# Patient Record
Sex: Female | Born: 1935 | Race: White | Hispanic: No | Smoking: Former smoker
Health system: Southern US, Community
[De-identification: ages and names within clinical notes are randomized; demographics above are authoritative.]

## PROBLEM LIST (undated history)

## (undated) DIAGNOSIS — H269 Unspecified cataract: Secondary | ICD-10-CM

## (undated) DIAGNOSIS — C801 Malignant (primary) neoplasm, unspecified: Secondary | ICD-10-CM

## (undated) DIAGNOSIS — F0150 Vascular dementia without behavioral disturbance: Secondary | ICD-10-CM

## (undated) DIAGNOSIS — H9190 Unspecified hearing loss, unspecified ear: Secondary | ICD-10-CM

## (undated) DIAGNOSIS — M810 Age-related osteoporosis without current pathological fracture: Secondary | ICD-10-CM

## (undated) HISTORY — DX: Unspecified hearing loss, unspecified ear: H91.90

## (undated) HISTORY — DX: Unspecified cataract: H26.9

## (undated) HISTORY — DX: Age-related osteoporosis without current pathological fracture: M81.0

## (undated) HISTORY — PX: BREAST CYST ASPIRATION: SHX578

## (undated) HISTORY — PX: HERNIA REPAIR: SHX51

## (undated) HISTORY — DX: Vascular dementia, unspecified severity, without behavioral disturbance, psychotic disturbance, mood disturbance, and anxiety: F01.50

## (undated) HISTORY — PX: BREAST BIOPSY: SHX20

## (undated) HISTORY — PX: EYE SURGERY: SHX253

---

## 2001-02-14 ENCOUNTER — Encounter: Admission: RE | Admit: 2001-02-14 | Discharge: 2001-02-14 | Payer: Self-pay | Admitting: General Surgery

## 2001-02-14 ENCOUNTER — Encounter: Payer: Self-pay | Admitting: General Surgery

## 2004-01-01 ENCOUNTER — Ambulatory Visit: Payer: Self-pay | Admitting: Family Medicine

## 2005-02-16 ENCOUNTER — Ambulatory Visit: Payer: Self-pay | Admitting: Family Medicine

## 2005-02-25 ENCOUNTER — Ambulatory Visit: Payer: Self-pay | Admitting: Family Medicine

## 2005-09-02 ENCOUNTER — Ambulatory Visit: Payer: Self-pay | Admitting: General Surgery

## 2006-03-23 ENCOUNTER — Ambulatory Visit: Payer: Self-pay | Admitting: Family Medicine

## 2007-04-28 ENCOUNTER — Ambulatory Visit: Payer: Self-pay | Admitting: Family Medicine

## 2008-04-30 ENCOUNTER — Ambulatory Visit: Payer: Self-pay | Admitting: Family Medicine

## 2009-01-16 ENCOUNTER — Ambulatory Visit: Payer: Self-pay | Admitting: Family Medicine

## 2009-05-22 ENCOUNTER — Ambulatory Visit: Payer: Self-pay | Admitting: Family Medicine

## 2010-05-26 ENCOUNTER — Ambulatory Visit: Payer: Self-pay | Admitting: Family Medicine

## 2010-06-08 ENCOUNTER — Emergency Department: Payer: Self-pay | Admitting: Unknown Physician Specialty

## 2011-06-26 ENCOUNTER — Ambulatory Visit: Payer: Self-pay | Admitting: Family Medicine

## 2012-06-30 ENCOUNTER — Ambulatory Visit: Payer: Self-pay | Admitting: Family Medicine

## 2013-02-15 ENCOUNTER — Encounter: Payer: Self-pay | Admitting: Podiatry

## 2013-02-15 ENCOUNTER — Ambulatory Visit (INDEPENDENT_AMBULATORY_CARE_PROVIDER_SITE_OTHER): Payer: Medicare Other | Admitting: Podiatry

## 2013-02-15 VITALS — BP 149/89 | HR 93 | Resp 16 | Ht 63.0 in | Wt 125.0 lb

## 2013-02-15 DIAGNOSIS — M79609 Pain in unspecified limb: Secondary | ICD-10-CM

## 2013-02-15 DIAGNOSIS — B351 Tinea unguium: Secondary | ICD-10-CM

## 2013-02-15 DIAGNOSIS — B353 Tinea pedis: Secondary | ICD-10-CM

## 2013-02-15 NOTE — Progress Notes (Signed)
   Subjective:    Patient ID: Bridget Nolan, female    DOB: Aug 04, 1921, 80 y.o.   MRN: [id number]  HPI Comments: Its the 4th toenail on my right foot. It does not hurt. It needs cutting, but i waited till i came in here so he could see it. Its been thick and black with this white stuff under it for about 6 months.     Review of Systems  Constitutional: Negative.   HENT: Positive for hearing loss.   Eyes: Negative.   Respiratory: Negative.   Cardiovascular: Negative.   Gastrointestinal: Negative.   Endocrine: Negative.   Genitourinary: Negative.   Musculoskeletal: Negative.   Skin: Negative.   Allergic/Immunologic: Negative.   Hematological: Bruises/bleeds easily.  Psychiatric/Behavioral: Negative.        Objective:   Physical Exam: I have reviewed her past medical history medications allergies surgeries social history and review of systems. Vital signs are stable she is alert and oriented x3. Pulses are strongly palpable bilateral foot. Neurologic sensorium is intact per Semmes-Weinstein monofilament bilateral. Deep tendon reflexes bilateral are intact and brisk. Muscle strength + over 5 dorsiflexors plantar flexors inverters everters all intrinsic musculature is intact. Orthopedic evaluation demonstrates all joints distal to the ankle a full range of motion without crepitation. Cutaneous evaluation demonstrates supple well hydrated cutis normal nail plates bilateral. The only exception to that is the fourth digit of the right foot which demonstrates a reactive thickened nail plate secondary to a slight hammertoe deformity. There is subungual hematoma present with subungual debris.        Assessment & Plan:  Assessment: Pain in limb secondary to onychomycosis fourth digit of the right foot.  Plan: Debridement today of the nail plate to the fourth digit of the right foot. Followup with her as needed.

## 2013-07-03 ENCOUNTER — Ambulatory Visit: Payer: Self-pay | Admitting: Family Medicine

## 2014-06-12 ENCOUNTER — Other Ambulatory Visit: Payer: Self-pay | Admitting: Family Medicine

## 2014-06-12 DIAGNOSIS — Z1231 Encounter for screening mammogram for malignant neoplasm of breast: Secondary | ICD-10-CM

## 2014-06-26 ENCOUNTER — Ambulatory Visit: Payer: Self-pay

## 2014-07-05 ENCOUNTER — Ambulatory Visit
Admission: RE | Admit: 2014-07-05 | Discharge: 2014-07-05 | Disposition: A | Discharge status: Home / Self Care | Payer: Medicare Other | Source: Ambulatory Visit | Attending: Family Medicine | Admitting: Family Medicine

## 2014-07-05 DIAGNOSIS — R922 Inconclusive mammogram: Secondary | ICD-10-CM | POA: Insufficient documentation

## 2014-07-05 DIAGNOSIS — Z1231 Encounter for screening mammogram for malignant neoplasm of breast: Secondary | ICD-10-CM | POA: Diagnosis not present

## 2014-07-05 HISTORY — DX: Malignant (primary) neoplasm, unspecified: C80.1

## 2015-02-01 ENCOUNTER — Emergency Department: Payer: Medicare Other

## 2015-02-01 ENCOUNTER — Inpatient Hospital Stay
Admission: EM | Admit: 2015-02-01 | Discharge: 2015-02-04 | DRG: 481 | Disposition: A | Discharge status: Skilled Nursing Facility | Payer: Medicare Other | Attending: Internal Medicine | Admitting: Internal Medicine

## 2015-02-01 DIAGNOSIS — W010XXA Fall on same level from slipping, tripping and stumbling without subsequent striking against object, initial encounter: Secondary | ICD-10-CM | POA: Diagnosis present

## 2015-02-01 DIAGNOSIS — Z880 Allergy status to penicillin: Secondary | ICD-10-CM | POA: Diagnosis not present

## 2015-02-01 DIAGNOSIS — Y92090 Kitchen in other non-institutional residence as the place of occurrence of the external cause: Secondary | ICD-10-CM | POA: Diagnosis not present

## 2015-02-01 DIAGNOSIS — R739 Hyperglycemia, unspecified: Secondary | ICD-10-CM | POA: Diagnosis present

## 2015-02-01 DIAGNOSIS — S72002A Fracture of unspecified part of neck of left femur, initial encounter for closed fracture: Secondary | ICD-10-CM

## 2015-02-01 DIAGNOSIS — Z85828 Personal history of other malignant neoplasm of skin: Secondary | ICD-10-CM

## 2015-02-01 DIAGNOSIS — H9190 Unspecified hearing loss, unspecified ear: Secondary | ICD-10-CM | POA: Diagnosis present

## 2015-02-01 DIAGNOSIS — S72142A Displaced intertrochanteric fracture of left femur, initial encounter for closed fracture: Secondary | ICD-10-CM | POA: Diagnosis present

## 2015-02-01 DIAGNOSIS — Z01818 Encounter for other preprocedural examination: Secondary | ICD-10-CM

## 2015-02-01 DIAGNOSIS — M8580 Other specified disorders of bone density and structure, unspecified site: Secondary | ICD-10-CM | POA: Diagnosis present

## 2015-02-01 DIAGNOSIS — S7292XA Unspecified fracture of left femur, initial encounter for closed fracture: Secondary | ICD-10-CM

## 2015-02-01 DIAGNOSIS — E871 Hypo-osmolality and hyponatremia: Secondary | ICD-10-CM | POA: Diagnosis present

## 2015-02-01 DIAGNOSIS — Y939 Activity, unspecified: Secondary | ICD-10-CM | POA: Diagnosis not present

## 2015-02-01 LAB — CBC WITH DIFFERENTIAL/PLATELET
BASOS ABS: 0 10*3/uL (ref 0–0.1)
BASOS PCT: 0 %
Eosinophils Absolute: 0.1 10*3/uL (ref 0–0.7)
Eosinophils Relative: 1 %
HEMATOCRIT: 39.2 % (ref 35.0–47.0)
HEMOGLOBIN: 13.5 g/dL (ref 12.0–16.0)
Lymphocytes Relative: 5 %
Lymphs Abs: 0.5 10*3/uL — ABNORMAL LOW (ref 1.0–3.6)
MCH: 33.6 pg (ref 26.0–34.0)
MCHC: 34.4 g/dL (ref 32.0–36.0)
MCV: 97.8 fL (ref 80.0–100.0)
MONO ABS: 0.5 10*3/uL (ref 0.2–0.9)
Monocytes Relative: 5 %
NEUTROS ABS: 9.9 10*3/uL — AB (ref 1.4–6.5)
NEUTROS PCT: 89 %
Platelets: 200 10*3/uL (ref 150–440)
RBC: 4.01 MIL/uL (ref 3.80–5.20)
RDW: 12.3 % (ref 11.5–14.5)
WBC: 11 10*3/uL (ref 3.6–11.0)

## 2015-02-01 LAB — PROTIME-INR
INR: 0.99
Prothrombin Time: 13.3 seconds (ref 11.4–15.0)

## 2015-02-01 LAB — BASIC METABOLIC PANEL
ANION GAP: 5 (ref 5–15)
BUN: 23 mg/dL — ABNORMAL HIGH (ref 6–20)
CALCIUM: 9.1 mg/dL (ref 8.9–10.3)
CO2: 24 mmol/L (ref 22–32)
Chloride: 107 mmol/L (ref 101–111)
Creatinine, Ser: 0.89 mg/dL (ref 0.44–1.00)
GFR calc non Af Amer: 55 mL/min — ABNORMAL LOW (ref >60)
Glucose, Bld: 171 mg/dL — ABNORMAL HIGH (ref 65–99)
Potassium: 3.3 mmol/L — ABNORMAL LOW (ref 3.5–5.1)
Sodium: 136 mmol/L (ref 135–145)

## 2015-02-01 LAB — URINALYSIS COMPLETE WITH MICROSCOPIC (ARMC ONLY)
BACTERIA UA: NONE SEEN
Bilirubin Urine: NEGATIVE
Glucose, UA: 50 mg/dL — AB
Hgb urine dipstick: NEGATIVE
Ketones, ur: NEGATIVE mg/dL
Leukocytes, UA: NEGATIVE
Nitrite: NEGATIVE
PH: 7 (ref 5.0–8.0)
PROTEIN: NEGATIVE mg/dL
SQUAMOUS EPITHELIAL / LPF: NONE SEEN
Specific Gravity, Urine: 1.012 (ref 1.005–1.030)

## 2015-02-01 MED ORDER — OXYCODONE HCL 5 MG PO TABS
5.0000 mg | ORAL_TABLET | ORAL | Status: DC | PRN
  Administered 2015-02-02 (×2): 5 mg via ORAL
  Filled 2015-02-01 (×3): qty 1

## 2015-02-01 MED ORDER — ONDANSETRON HCL 4 MG PO TABS: 4.0000 mg | ORAL_TABLET | Freq: Four times a day (QID) | ORAL | Status: DC | PRN

## 2015-02-01 MED ORDER — ACETAMINOPHEN 325 MG PO TABS
650.0000 mg | ORAL_TABLET | Freq: Four times a day (QID) | ORAL | Status: DC | PRN
  Administered 2015-02-02: 650 mg via ORAL
  Filled 2015-02-01: qty 2

## 2015-02-01 MED ORDER — SODIUM CHLORIDE 0.9 % IV SOLN
INTRAVENOUS | Status: DC
  Administered 2015-02-01 – 2015-02-03 (×3): via INTRAVENOUS

## 2015-02-01 MED ORDER — CLINDAMYCIN PHOSPHATE 600 MG/50ML IV SOLN
600.0000 mg | Freq: Once | INTRAVENOUS | Status: AC
  Administered 2015-02-02: 600 mg via INTRAVENOUS
  Filled 2015-02-01: qty 50

## 2015-02-01 MED ORDER — MORPHINE SULFATE (PF) 2 MG/ML IV SOLN
2.0000 mg | Freq: Once | INTRAVENOUS | Status: AC
  Administered 2015-02-01: 2 mg via INTRAVENOUS
  Filled 2015-02-01: qty 1

## 2015-02-01 MED ORDER — ONDANSETRON HCL 4 MG/2ML IJ SOLN: 4.0000 mg | Freq: Four times a day (QID) | INTRAMUSCULAR | Status: DC | PRN

## 2015-02-01 MED ORDER — MORPHINE SULFATE (PF) 2 MG/ML IV SOLN
2.0000 mg | INTRAVENOUS | Status: DC | PRN
  Administered 2015-02-02 – 2015-02-03 (×4): 2 mg via INTRAVENOUS
  Filled 2015-02-01 (×5): qty 1

## 2015-02-01 MED ORDER — ACETAMINOPHEN 650 MG RE SUPP: 650.0000 mg | Freq: Four times a day (QID) | RECTAL | Status: DC | PRN

## 2015-02-01 MED ORDER — ONDANSETRON HCL 4 MG/2ML IJ SOLN
4.0000 mg | Freq: Once | INTRAMUSCULAR | Status: AC
  Administered 2015-02-01: 4 mg via INTRAVENOUS
  Filled 2015-02-01: qty 2

## 2015-02-01 MED ORDER — DOCUSATE SODIUM 100 MG PO CAPS
100.0000 mg | ORAL_CAPSULE | Freq: Two times a day (BID) | ORAL | Status: DC
  Administered 2015-02-01 – 2015-02-04 (×4): 100 mg via ORAL
  Filled 2015-02-01 (×5): qty 1

## 2015-02-01 NOTE — H&P (Signed)
[facility] [place] NAME: Bridget Nolan    MR#:  [id number]  DATE OF BIRTH:  Dec 18, 1921   DATE OF ADMISSION:  02/01/2015  PRIMARY CARE PHYSICIAN: Maryland Pink, MD   REQUESTING/REFERRING PHYSICIAN: Archie Balboa  CHIEF COMPLAINT:   Chief Complaint  Patient presents with  . Fall    left hip    HISTORY OF PRESENT ILLNESS:  Bridget Nolan  is a 81 y.o. female with a known history of skin cancer unspecified, osteopenia who is presenting after mechanical fall. She describes suffering mechanical fall at her nursing facility had immediate pain left hip described only as "pain" intensity 9/10 nonradiating worse with movements no relieving factors. Found to have a left intertrochanteric femur fracture  PAST MEDICAL HISTORY:   Past Medical History  Diagnosis Date  . Hearing loss   . Cancer ([place]) skin    PAST SURGICAL HISTORY:   Past Surgical History  Procedure Laterality Date  . Hernia repair    . Eye surgery Bilateral   . Breast biopsy Left neg  . Breast cyst aspiration Bilateral     SOCIAL HISTORY:   Social History  Substance Use Topics  . Smoking status: Never Smoker   . Smokeless tobacco: Never Used  . Alcohol Use: 4.8 oz/week    7 Glasses of wine, 1 Cans of beer per week     Comment: most days    FAMILY HISTORY:   Family History  Problem Relation Age of Onset  . Diabetes Neg Hx     DRUG ALLERGIES:   Allergies  Allergen Reactions  . Penicillins Rash    Has patient had a PCN reaction causing immediate rash, facial/tongue/throat swelling, SOB or lightheadedness with hypotension: Yes Has patient had a PCN reaction causing severe rash involving mucus membranes or skin necrosis: No Has patient had a PCN reaction that required hospitalization Yes Has patient had a PCN reaction occurring within the last 10 years: No If all of the above answers are "NO", then may proceed with Cephalosporin use.    REVIEW OF  SYSTEMS:  REVIEW OF SYSTEMS:  CONSTITUTIONAL: Denies fevers, chills, fatigue, weakness.  EYES: Denies blurred vision, double vision, or eye pain.  EARS, NOSE, THROAT: Denies tinnitus, ear pain, hearing loss.  RESPIRATORY: denies cough, shortness of breath, wheezing  CARDIOVASCULAR: Denies chest pain, palpitations, edema.  GASTROINTESTINAL: Denies nausea, vomiting, diarrhea, abdominal pain.  GENITOURINARY: Denies dysuria, hematuria.  ENDOCRINE: Denies nocturia or thyroid problems. HEMATOLOGIC AND LYMPHATIC: Denies easy bruising or bleeding.  SKIN: Denies rash or lesions.  MUSCULOSKELETAL: Denies pain in neck, back, shoulder, knees, , or further arthritic symptoms. Positive hip pain NEUROLOGIC: Denies paralysis, paresthesias.  PSYCHIATRIC: Denies anxiety or depressive symptoms. Otherwise full review of systems performed by me is negative.   MEDICATIONS AT HOME:   Prior to Admission medications   Medication Sig Start Date End Date Taking? Authorizing Provider  BIOTIN PO Take by mouth daily.   Yes Historical Provider, MD  cholecalciferol (VITAMIN D) 400 units TABS tablet Take 400 Units by mouth daily.   Yes Historical Provider, MD  Multiple Vitamin (MULTIVITAMIN WITH MINERALS) TABS tablet Take 1 tablet by mouth daily.   Yes Historical Provider, MD      VITAL SIGNS:  Blood pressure 160/80, pulse 102, temperature 97.6 F (36.4 C), temperature source Oral, resp. rate 17, height 5\' 3"  (1.6 m), weight 130 lb (58.968 kg), SpO2 87 %.  PHYSICAL EXAMINATION:  VITAL SIGNS: Filed Vitals:  02/01/15 2032 02/01/15 2230  BP: 160/89 160/80  Pulse: 102 102  Temp: 97.6 F (36.4 C)   Resp: 22 17   GENERAL:81 y.o.female currently in no acute distress.  HEAD: Normocephalic, atraumatic.  EYES: Pupils equal, round, reactive to light. Extraocular muscles intact. No scleral icterus.  MOUTH: Moist mucosal membrane. Dentition intact. No abscess noted.  EAR, NOSE, THROAT: Clear without exudates. No  external lesions.  NECK: Supple. No thyromegaly. No nodules. No JVD.  PULMONARY: Clear to ascultation, without wheeze rails or rhonci. No use of accessory muscles, Good respiratory effort. good air entry bilaterally CHEST: Nontender to palpation.  CARDIOVASCULAR: S1 and S2. Regular rate and rhythm. No murmurs, rubs, or gallops. No edema. Pedal pulses 2+ bilaterally.  GASTROINTESTINAL: Soft, nontender, nondistended. No masses. Positive bowel sounds. No hepatosplenomegaly.  MUSCULOSKELETAL: No swelling, clubbing, or edema. Range of motion limited left lower extremity second 2 fracture NEUROLOGIC: Cranial nerves II through XII are intact. No gross focal neurological deficits. Sensation intact. Reflexes intact.  SKIN: No ulceration, lesions, rashes, or cyanosis. Skin warm and dry. Turgor intact.  PSYCHIATRIC: Mood, affect within normal limits. The patient is awake, alert and oriented x 3. Insight, judgment intact.    LABORATORY PANEL:   CBC  Recent Labs Lab 02/01/15 2208  WBC 11.0  HGB 13.5  HCT 39.2  PLT 200   ------------------------------------------------------------------------------------------------------------------  Chemistries   Recent Labs Lab 02/01/15 2208  NA 136  K 3.3*  CL 107  CO2 24  GLUCOSE 171*  BUN 23*  CREATININE 0.89  CALCIUM 9.1   ------------------------------------------------------------------------------------------------------------------  Cardiac Enzymes No results for input(s): TROPONINI in the last 168 hours. ------------------------------------------------------------------------------------------------------------------  RADIOLOGY:  Dg Chest 1 View  02/01/2015  CLINICAL DATA:  Preop evaluation.  Femur fracture. EXAM: CHEST 1 VIEW COMPARISON:  06/08/2010 FINDINGS: Osteopenia. Patient rotated minimally left. Cardiomegaly accentuated by AP portable technique. Right hemidiaphragm elevation. No right-sided pleural effusion. No pneumothorax.  Moderate pulmonary interstitial thickening. Suboptimal evaluation of the inferior left hemi thorax. IMPRESSION: Cardiomegaly. Pulmonary interstitial thickening is nonspecific in this age group and could be partially artifactual, secondary to AP portable technique. Suboptimal evaluation of the inferior lateral left hemi thorax. Increased density in this area is likely due to overlying soft tissues. If there are cardiopulmonary symptoms, recommend PA and lateral radiographs. Electronically Signed   By: Abigail Miyamoto M.D.   On: 02/01/2015 21:20   Dg Hip Unilat With Pelvis 2-3 Views Left  02/01/2015  CLINICAL DATA:  Fall.  Initial encounter. EXAM: DG HIP (WITH OR WITHOUT PELVIS) 2-3V LEFT COMPARISON:  None. FINDINGS: Femoral heads are located. Mild osteopenia. Surgical clips projecting over the right hemipelvis. Markedly comminuted intratrochanteric left femur fracture. Varus angulation. IMPRESSION: Comminuted intratrochanteric left femur fracture. Electronically Signed   By: Abigail Miyamoto M.D.   On: 02/01/2015 21:17    EKG:   Orders placed or performed during the hospital encounter of 02/01/15  . ED EKG  . ED EKG  . EKG 12-Lead  . EKG 12-Lead    IMPRESSION AND PLAN:   81 year old Caucasian female history of osteopenia who is presenting after mechanical fall on  1. Preoperative evaluation for left intertrochanteric femur fracture: Moderate risk for moderate risk surgery no further intervention or testing required prior to surgery, perioperative medical management continue all home medications, she has no evidence of significant cardiovascular illness including congestive heart failure, valvular, arrhythmia. Continue pain medication, add bowel regiment, nothing by mouth after midnight for planned orthopedic or seizure 2. Venous thromboembolism  prophylactic: SCDs    All the records are reviewed and case discussed with ED provider. Management plans discussed with the patient, family and they are in  agreement.  CODE STATUS: DO NOT RESUSCITATE  TOTAL TIME TAKING CARE OF THIS PATIENT: 35 minutes.    Hower,  Karenann Cai.D on 02/01/2015 at 11:03 PM  Between 7am to 6pm - Pager - [phone]  After 6pm: House Pager: - [phone]  [facility]  Office  [phone]  CC: Primary care physician; Maryland Pink, MD

## 2015-02-01 NOTE — ED Notes (Signed)
Pt requested apple juice. MD notified. MD approved - Pt can drink until midnight.

## 2015-02-01 NOTE — ED Notes (Signed)
Pt reports she slipped/fell in the kitchen.  Her rubber soles stuck on the floor and she fell directly on her hip - she did not try to catch herself.  Pt denies LOC/head injury.  PT reports pain at 7 out of 10 in the left hip radiating down the left leg.

## 2015-02-01 NOTE — Consult Note (Signed)
ORTHOPAEDIC CONSULTATION  REQUESTING PHYSICIAN: Nance Pear, MD  Chief Complaint: Left hip pain  HPI: Bridget Nolan is a 81 y.o. female who complains of  Left hip pain after a mechanical fall this evening around 1900. She denies any LOC before or after the fall. She describes her pain as a sharp pain in her left hip that is 3/10 at rest and 6/10 with any attempted motion. She is currently comfortable on the ER bed and has no other complaints. She denies any headache, blurry vision or double vision. She denies any neck pain. She denies any chest pain or shortness of breath.  Past Medical History  Diagnosis Date  . Hearing loss   . Cancer [facility]) skin   Past Surgical History  Procedure Laterality Date  . Hernia repair    . Eye surgery Bilateral   . Breast biopsy Left neg  . Breast cyst aspiration Bilateral    Social History   Social History  . Marital Status: Married    Spouse Name: N/A  . Number of Children: N/A  . Years of Education: N/A   Social History Main Topics  . Smoking status: Never Smoker   . Smokeless tobacco: Never Used  . Alcohol Use: 4.8 oz/week    1 Cans of beer, 7 Glasses of wine per week     Comment: most days  . Drug Use: None  . Sexual Activity: Not Asked   Other Topics Concern  . None   Social History Narrative   History reviewed. No pertinent family history. Allergies  Allergen Reactions  . Penicillins Rash    Has patient had a PCN reaction causing immediate rash, facial/tongue/throat swelling, SOB or lightheadedness with hypotension: Yes Has patient had a PCN reaction causing severe rash involving mucus membranes or skin necrosis: No Has patient had a PCN reaction that required hospitalization Yes Has patient had a PCN reaction occurring within the last 10 years: No If all of the above answers are "NO", then may proceed with Cephalosporin use.   Prior to Admission medications   Medication Sig Start Date End Date Taking? Authorizing  Provider  BIOTIN PO Take by mouth daily.   Yes Historical Provider, MD  cholecalciferol (VITAMIN D) 400 units TABS tablet Take 400 Units by mouth daily.   Yes Historical Provider, MD  Multiple Vitamin (MULTIVITAMIN WITH MINERALS) TABS tablet Take 1 tablet by mouth daily.   Yes Historical Provider, MD   Dg Chest 1 View  02/01/2015  CLINICAL DATA:  Preop evaluation.  Femur fracture. EXAM: CHEST 1 VIEW COMPARISON:  06/08/2010 FINDINGS: Osteopenia. Patient rotated minimally left. Cardiomegaly accentuated by AP portable technique. Right hemidiaphragm elevation. No right-sided pleural effusion. No pneumothorax. Moderate pulmonary interstitial thickening. Suboptimal evaluation of the inferior left hemi thorax. IMPRESSION: Cardiomegaly. Pulmonary interstitial thickening is nonspecific in this age group and could be partially artifactual, secondary to AP portable technique. Suboptimal evaluation of the inferior lateral left hemi thorax. Increased density in this area is likely due to overlying soft tissues. If there are cardiopulmonary symptoms, recommend PA and lateral radiographs. Electronically Signed   By: Abigail Miyamoto M.D.   On: 02/01/2015 21:20   Dg Hip Unilat With Pelvis 2-3 Views Left  02/01/2015  CLINICAL DATA:  Fall.  Initial encounter. EXAM: DG HIP (WITH OR WITHOUT PELVIS) 2-3V LEFT COMPARISON:  None. FINDINGS: Femoral heads are located. Mild osteopenia. Surgical clips projecting over the right hemipelvis. Markedly comminuted intratrochanteric left femur fracture. Varus angulation. IMPRESSION: Comminuted intratrochanteric left femur  fracture. Electronically Signed   By: Abigail Miyamoto M.D.   On: 02/01/2015 21:17    Positive ROS: All other systems have been reviewed and were otherwise negative with the exception of those mentioned in the HPI and as above.  Physical Exam: General: Alert, no acute distress Cardiovascular: No pedal edema Respiratory: No cyanosis, no use of accessory musculature GI:  No organomegaly, abdomen is soft and non-tender Skin: No lesions in the area of chief complaint Neurologic: Sensation intact distally Psychiatric: Patient is competent for consent with normal mood and affect Lymphatic: No axillary or cervical lymphadenopathy  MUSCULOSKELETAL: she maintains her left hip and knee in a flexed, externally rotated posture. She has 2+ DP pulse and 1+ PT pulse bilaterally. She is able to actively move her left toes and ankle on command. She has some globally decreased sensation to light touch throughout the left foot in all nerve distributions.  Assessment: 81 y/o female with a left intertrochanteric femur fracture  Plan: Admit to hospital for risk stratification/ medical clearance and plan to proceed to OR tomorrow for intramedullary nail fixation of the left proximal femur       02/01/2015 10:29 PM

## 2015-02-01 NOTE — ED Notes (Signed)
MD at bedside. 

## 2015-02-01 NOTE — ED Provider Notes (Signed)
[facility] Emergency Department Provider Note    ____________________________________________  Time seen: 2150  I have reviewed the triage vital signs and the nursing notes.   HISTORY  Chief Complaint Fall   History limited by: Not Limited   HPI Bridget Nolan is a 81 y.o. female who presents to the emergency department today because of left hip pain. It started suddenly just prior to arrival. It started after the patient fell. She states she thinks she got her foot caught on the ground and fell onto the left side. The pain is now severe and worse with movement. Patient denies hitting her head. She denies any loss of consciousness or neck pain. Denies any pain in her upper extremities. Denies any chest pain shortness breath or palpitations at time of the fall.     Past Medical History  Diagnosis Date  . Hearing loss   . Cancer ([place]) skin    There are no active problems to display for this patient.   Past Surgical History  Procedure Laterality Date  . Hernia repair    . Eye surgery Bilateral   . Breast biopsy Left neg  . Breast cyst aspiration Bilateral     Current Outpatient Rx  Name  Route  Sig  Dispense  Refill  . BIOTIN PO   Oral   Take by mouth daily.         . Cholecalciferol (VITAMIN D PO)   Oral   Take by mouth daily.         . Multiple Vitamins-Minerals (MULTIVITAMIN PO)   Oral   Take by mouth daily.           Allergies Penicillins  History reviewed. No pertinent family history.  Social History Social History  Substance Use Topics  . Smoking status: Never Smoker   . Smokeless tobacco: Never Used  . Alcohol Use: 4.8 oz/week    1 Cans of beer, 7 Glasses of wine per week     Comment: most days    Review of Systems  Constitutional: Negative for fever. Cardiovascular: Negative for chest pain. Respiratory: Negative for shortness of breath. Gastrointestinal: Negative for abdominal pain, vomiting and  diarrhea. Musculoskeletal: Positive for left hip pain Neurological: Negative for headaches, focal weakness or numbness.   10-point ROS otherwise negative.  ____________________________________________   PHYSICAL EXAM:  VITAL SIGNS: ED Triage Vitals  Enc Vitals Group     BP 02/01/15 2030 160/88 mmHg     Pulse Rate 02/01/15 2030 104     Resp 02/01/15 2030 17     Temp 02/01/15 2032 97.6 F (36.4 C)     Temp Source 02/01/15 2032 Oral     SpO2 02/01/15 2024 96 %     Weight 02/01/15 2032 130 lb (58.968 kg)     Height 02/01/15 2032 5\' 3"  (1.6 m)     Head Cir --      Peak Flow --      Pain Score 02/01/15 2033 7   Constitutional: Alert and oriented. Well appearing and in no distress. Eyes: Conjunctivae are normal. PERRL. Normal extraocular movements. ENT   Head: Normocephalic and atraumatic.   Nose: No congestion/rhinnorhea.   Mouth/Throat: Mucous membranes are moist.   Neck: No stridor. No midline tenderness. Hematological/Lymphatic/Immunilogical: No cervical lymphadenopathy. Cardiovascular: Normal rate, regular rhythm.  No murmurs, rubs, or gallops. Respiratory: Normal respiratory effort without tachypnea nor retractions. Breath sounds are clear and equal bilaterally. No wheezes/rales/rhonchi. Gastrointestinal: Soft and nontender. No distention.  Genitourinary: Deferred Musculoskeletal: Left leg held in external rotation. Tenderness to manipulation and palpation of the left hip. Skin intact. Neurovascularly intact distally. No other extremity deformities. Neurologic:  Normal speech and language. No gross focal neurologic deficits are appreciated.  Skin:  Skin is warm, dry and intact. No rash noted. Psychiatric: Mood and affect are normal. Speech and behavior are normal. Patient exhibits appropriate insight and judgment.  ____________________________________________    LABS (pertinent positives/negatives)  Labs Reviewed  URINALYSIS COMPLETEWITH MICROSCOPIC  ([place] ONLY) - Abnormal; Notable for the following:    Color, Urine STRAW (*)    APPearance CLEAR (*)    Glucose, UA 50 (*)    All other components within normal limits  CBC WITH DIFFERENTIAL/PLATELET - Abnormal; Notable for the following:    Neutro Abs 9.9 (*)    Lymphs Abs 0.5 (*)    All other components within normal limits  BASIC METABOLIC PANEL - Abnormal; Notable for the following:    Potassium 3.3 (*)    Glucose, Bld 171 (*)    BUN 23 (*)    GFR calc non Af Amer 55 (*)    All other components within normal limits  PROTIME-INR  CBC  BASIC METABOLIC PANEL     ____________________________________________   EKG  Apolonio Schneiders, attending physician, personally viewed and interpreted this EKG  EKG Time: 2224 Rate: 100 Rhythm: sinus tachycardia Axis: left axis deviation Intervals: qtc 494 QRS: atypical RBBB ST changes: no st elevation Impression: abnormal ekg   ____________________________________________    RADIOLOGY CXR  IMPRESSION: Cardiomegaly. Pulmonary interstitial thickening is nonspecific in this age group and could be partially artifactual, secondary to AP portable technique.  Suboptimal evaluation of the inferior lateral left hemi thorax. Increased density in this area is likely due to overlying soft tissues. If there are cardiopulmonary symptoms, recommend PA and lateral radiographs.  Left hip IMPRESSION: Comminuted intratrochanteric left femur fracture.   ____________________________________________   PROCEDURES  Procedure(s) performed: None  Critical Care performed: No  ____________________________________________   INITIAL IMPRESSION / ASSESSMENT AND PLAN / ED COURSE  Pertinent labs & imaging results that were available during my care of the patient were reviewed by me and considered in my medical decision making (see chart for details).  Patient presented to the emergency department after what sounds like a mechanical fall  because of left hip pain. X-rays and physical exam consistent with a left hip fracture. Neurovascularly intact. I did discuss with orthopedics on-call. Will plan on admission to the hospital service.  ____________________________________________   FINAL CLINICAL IMPRESSION(S) / ED DIAGNOSES  Final diagnoses:  Closed fracture of left femur, unspecified fracture morphology, initial encounter ([place])     Nance Pear, MD 02/01/15 2330

## 2015-02-02 ENCOUNTER — Inpatient Hospital Stay: Payer: Medicare Other

## 2015-02-02 ENCOUNTER — Inpatient Hospital Stay: Payer: Medicare Other | Admitting: Anesthesiology

## 2015-02-02 ENCOUNTER — Encounter: Payer: Self-pay | Admitting: Anesthesiology

## 2015-02-02 ENCOUNTER — Encounter
Admission: EM | Disposition: A | Discharge status: Skilled Nursing Facility | Payer: Self-pay | Source: Home / Self Care | Attending: Internal Medicine

## 2015-02-02 HISTORY — PX: INTRAMEDULLARY (IM) NAIL INTERTROCHANTERIC: SHX5875

## 2015-02-02 LAB — BASIC METABOLIC PANEL
ANION GAP: 5 (ref 5–15)
BUN: 18 mg/dL (ref 6–20)
CHLORIDE: 106 mmol/L (ref 101–111)
CO2: 25 mmol/L (ref 22–32)
CREATININE: 0.72 mg/dL (ref 0.44–1.00)
Calcium: 8.8 mg/dL — ABNORMAL LOW (ref 8.9–10.3)
GFR calc non Af Amer: >60 mL/min (ref >60)
Glucose, Bld: 133 mg/dL — ABNORMAL HIGH (ref 65–99)
Potassium: 4.1 mmol/L (ref 3.5–5.1)
SODIUM: 136 mmol/L (ref 135–145)

## 2015-02-02 LAB — CBC
HEMATOCRIT: 37.4 % (ref 35.0–47.0)
HEMOGLOBIN: 12.8 g/dL (ref 12.0–16.0)
MCH: 33 pg (ref 26.0–34.0)
MCHC: 34.3 g/dL (ref 32.0–36.0)
MCV: 96.1 fL (ref 80.0–100.0)
Platelets: 184 10*3/uL (ref 150–440)
RBC: 3.89 MIL/uL (ref 3.80–5.20)
RDW: 12.3 % (ref 11.5–14.5)
WBC: 9.2 10*3/uL (ref 3.6–11.0)

## 2015-02-02 LAB — MRSA PCR SCREENING: MRSA by PCR: NEGATIVE

## 2015-02-02 SURGERY — FIXATION, FRACTURE, INTERTROCHANTERIC, WITH INTRAMEDULLARY ROD
Anesthesia: Spinal | Laterality: Left

## 2015-02-02 MED ORDER — BUPIVACAINE HCL (PF) 0.5 % IJ SOLN
INTRAMUSCULAR | Status: DC | PRN
  Administered 2015-02-02: 2.5 mL

## 2015-02-02 MED ORDER — FENTANYL CITRATE (PF) 100 MCG/2ML IJ SOLN
INTRAMUSCULAR | Status: DC | PRN
  Administered 2015-02-02 (×2): 25 ug via INTRAVENOUS

## 2015-02-02 MED ORDER — PROPOFOL 10 MG/ML IV BOLUS
INTRAVENOUS | Status: DC | PRN
  Administered 2015-02-02 (×3): 10 mg via INTRAVENOUS

## 2015-02-02 MED ORDER — LACTATED RINGERS IV SOLN
INTRAVENOUS | Status: DC | PRN
  Administered 2015-02-02: 14:00:00 via INTRAVENOUS

## 2015-02-02 MED ORDER — CHOLECALCIFEROL 10 MCG (400 UNIT) PO TABS
400.0000 [IU] | ORAL_TABLET | Freq: Every day | ORAL | Status: DC
  Administered 2015-02-03 – 2015-02-04 (×2): 400 [IU] via ORAL
  Filled 2015-02-02 (×2): qty 1

## 2015-02-02 MED ORDER — ONDANSETRON HCL 4 MG/2ML IJ SOLN: 4.0000 mg | Freq: Once | INTRAMUSCULAR | Status: DC | PRN

## 2015-02-02 MED ORDER — ENOXAPARIN SODIUM 40 MG/0.4ML ~~LOC~~ SOLN
40.0000 mg | SUBCUTANEOUS | Status: DC
  Administered 2015-02-03 – 2015-02-04 (×2): 40 mg via SUBCUTANEOUS
  Filled 2015-02-02 (×2): qty 0.4

## 2015-02-02 MED ORDER — ADULT MULTIVITAMIN W/MINERALS CH
1.0000 | ORAL_TABLET | Freq: Every day | ORAL | Status: DC
  Administered 2015-02-03 – 2015-02-04 (×2): 1 via ORAL
  Filled 2015-02-02 (×2): qty 1

## 2015-02-02 MED ORDER — FENTANYL CITRATE (PF) 100 MCG/2ML IJ SOLN: 25.0000 ug | INTRAMUSCULAR | Status: DC | PRN

## 2015-02-02 MED ORDER — CLINDAMYCIN PHOSPHATE 600 MG/50ML IV SOLN
600.0000 mg | Freq: Three times a day (TID) | INTRAVENOUS | Status: AC
  Administered 2015-02-02 – 2015-02-03 (×3): 600 mg via INTRAVENOUS
  Filled 2015-02-02 (×3): qty 50

## 2015-02-02 SURGICAL SUPPLY — 38 items
11.00mm titanium helical blade ×3 IMPLANT
BIT DRILL SPINE 4.0MMX260 (BIT) ×1
BIT DRILL SPINE 4.0X260 (BIT) ×2 IMPLANT
BLADE TI HELICAL 11.0 115 (BLADE) ×2 IMPLANT
BLADE TI HELICAL 11.0MM 115MM (BLADE) ×1
CANISTER SUCT 1200ML W/VALVE (MISCELLANEOUS) ×3 IMPLANT
DRAPE C-ARMOR (DRAPES) ×3 IMPLANT
DRAPE SHEET LG 3/4 BI-LAMINATE (DRAPES) ×3 IMPLANT
DRAPE TABLE BACK 80X90 (DRAPES) ×3 IMPLANT
DRSG DERMACEA 8X12 NADH (GAUZE/BANDAGES/DRESSINGS) ×3 IMPLANT
DRSG OPSITE POSTOP 4X12 (GAUZE/BANDAGES/DRESSINGS) ×3 IMPLANT
DRSG OPSITE POSTOP 4X6 (GAUZE/BANDAGES/DRESSINGS) ×3 IMPLANT
DURAPREP 26ML APPLICATOR (WOUND CARE) ×3 IMPLANT
GAUZE SPONGE 4X4 12PLY STRL (GAUZE/BANDAGES/DRESSINGS) ×3 IMPLANT
GLOVE BIO SURGEON STRL SZ7.5 (GLOVE) ×3 IMPLANT
GLOVE BIO SURGEON STRL SZ8 (GLOVE) ×3 IMPLANT
GLOVE BIOGEL M STRL SZ7.5 (GLOVE) ×3 IMPLANT
GLOVE INDICATOR 8.0 STRL GRN (GLOVE) ×3 IMPLANT
GLOVE SURG XRAY 8.5 LX (GLOVE) ×3 IMPLANT
GOWN STRL REUS W/ TWL LRG LVL4 (GOWN DISPOSABLE) ×2 IMPLANT
GOWN STRL REUS W/TWL LRG LVL4 (GOWN DISPOSABLE) ×4
KIT RM TURNOVER CYSTO AR (KITS) ×3 IMPLANT
MAT BLUE FLOOR 46X72 FLO (MISCELLANEOUS) ×3 IMPLANT
NAIL TROCH FX 12/130D 170-S (Nail) ×3 IMPLANT
NS IRRIG 1000ML POUR BTL (IV SOLUTION) ×3 IMPLANT
PACK HIP COMPR (MISCELLANEOUS) ×3 IMPLANT
PAD GROUND ADULT SPLIT (MISCELLANEOUS) ×3 IMPLANT
REAMER ROD DEEP FLUTE 2.5X950 (INSTRUMENTS) ×3 IMPLANT
SCREW LOCK TI 5.0X36 F/IM NAIL (Screw) ×3 IMPLANT
SOL PREP PVP 2OZ (MISCELLANEOUS) ×3
SOLUTION PREP PVP 2OZ (MISCELLANEOUS) ×1 IMPLANT
STAPLER SKIN PROX 35W (STAPLE) ×3 IMPLANT
SUCTION FRAZIER TIP 10 FR DISP (SUCTIONS) ×3 IMPLANT
SUT VIC AB 0 CT1 36 (SUTURE) ×3 IMPLANT
SUT VIC AB 1 CT1 36 (SUTURE) ×3 IMPLANT
SUT VIC AB 2-0 CT1 27 (SUTURE) ×2
SUT VIC AB 2-0 CT1 TAPERPNT 27 (SUTURE) ×1 IMPLANT
TAPE MICROFOAM 4IN (TAPE) ×3 IMPLANT

## 2015-02-02 NOTE — Progress Notes (Signed)
[facility] at [place] NAME: Bridget Nolan    MR#:  [id number]  DATE OF BIRTH:  Jun 12, 1921  SUBJECTIVE:  Patient is going to the operating room. She suffered a mechanical fall and now has fractured her left hip.   REVIEW OF SYSTEMS:    Review of Systems  Constitutional: Negative for fever, chills and malaise/fatigue.  HENT: Negative for sore throat.   Eyes: Negative for blurred vision.  Respiratory: Negative for cough, hemoptysis, shortness of breath and wheezing.   Cardiovascular: Negative for chest pain, palpitations and leg swelling.  Gastrointestinal: Negative for nausea, vomiting, abdominal pain, diarrhea and blood in stool.  Genitourinary: Negative for dysuria.  Musculoskeletal: Positive for joint pain and falls. Negative for back pain.  Neurological: Negative for dizziness, tremors and headaches.  Endo/Heme/Allergies: Does not bruise/bleed easily.    Tolerating Diet: Nothing by mouth      DRUG ALLERGIES:   Allergies  Allergen Reactions  . Penicillins Rash    Has patient had a PCN reaction causing immediate rash, facial/tongue/throat swelling, SOB or lightheadedness with hypotension: Yes Has patient had a PCN reaction causing severe rash involving mucus membranes or skin necrosis: No Has patient had a PCN reaction that required hospitalization Yes Has patient had a PCN reaction occurring within the last 10 years: No If all of the above answers are "NO", then may proceed with Cephalosporin use.    VITALS:  Blood pressure 154/71, pulse 90, temperature 98.6 F (37 C), temperature source Oral, resp. rate 18, height 5\' 3"  (1.6 m), weight 59.421 kg (131 lb), SpO2 93 %.  PHYSICAL EXAMINATION:   Physical Exam    LABORATORY PANEL:   CBC  Recent Labs Lab 02/02/15 0326  WBC 9.2  HGB 12.8  HCT 37.4  PLT 184    ------------------------------------------------------------------------------------------------------------------  Chemistries   Recent Labs Lab 02/02/15 0326  NA 136  K 4.1  CL 106  CO2 25  GLUCOSE 133*  BUN 18  CREATININE 0.72  CALCIUM 8.8*   ------------------------------------------------------------------------------------------------------------------  Cardiac Enzymes No results for input(s): TROPONINI in the last 168 hours. ------------------------------------------------------------------------------------------------------------------  RADIOLOGY:  Dg Chest 1 View  02/01/2015  CLINICAL DATA:  Preop evaluation.  Femur fracture. EXAM: CHEST 1 VIEW COMPARISON:  06/08/2010 FINDINGS: Osteopenia. Patient rotated minimally left. Cardiomegaly accentuated by AP portable technique. Right hemidiaphragm elevation. No right-sided pleural effusion. No pneumothorax. Moderate pulmonary interstitial thickening. Suboptimal evaluation of the inferior left hemi thorax. IMPRESSION: Cardiomegaly. Pulmonary interstitial thickening is nonspecific in this age group and could be partially artifactual, secondary to AP portable technique. Suboptimal evaluation of the inferior lateral left hemi thorax. Increased density in this area is likely due to overlying soft tissues. If there are cardiopulmonary symptoms, recommend PA and lateral radiographs. Electronically Signed   By: Abigail Miyamoto M.D.   On: 02/01/2015 21:20   Dg Hip Unilat With Pelvis 2-3 Views Left  02/01/2015  CLINICAL DATA:  Fall.  Initial encounter. EXAM: DG HIP (WITH OR WITHOUT PELVIS) 2-3V LEFT COMPARISON:  None. FINDINGS: Femoral heads are located. Mild osteopenia. Surgical clips projecting over the right hemipelvis. Markedly comminuted intratrochanteric left femur fracture. Varus angulation. IMPRESSION: Comminuted intratrochanteric left femur fracture. Electronically Signed   By: Abigail Miyamoto M.D.   On: 02/01/2015 21:17      ASSESSMENT AND PLAN:   81 year old female with a history of skin cancer who presented after mechanical fall and unfortunately has suffered a left femur fracture.   1. Left intertrochanteric  femur fracture: Patient is scheduled for the OR today. DVT prophylaxis as per orthopedic surgery and pain control as per orthopedic.   Management plans discussed with the patient and she is in agreement.  CODE STATUS:  DO NOT RESUSCITATE  TOTAL TIME TAKING CARE OF THIS PATIENT:  30 minutes.     POSSIBLE D/C  3 days, DEPENDING ON CLINICAL CONDITION.   Jaydian Santana M.D on 02/02/2015 at 11:18 AM  Between 7am to 6pm - Pager - [phone] After 6pm go to [email or web address] - password [email or web address] [facility]  Office  [phone]  CC: Primary care physician; Maryland Pink, MD  Note: This dictation was prepared with Dragon dictation along with smaller phrase technology. Any transcriptional errors that result from this process are unintentional.

## 2015-02-02 NOTE — Op Note (Signed)
DATE OF SURGERY:  02/02/2015  TIME: 3:35 PM  PATIENT NAME:  Bridget Nolan  AGE: 81 y.o.  PRE-OPERATIVE DIAGNOSIS:  left hip fracture  POST-OPERATIVE DIAGNOSIS:  SAME  PROCEDURE:  INTRAMEDULLARY (IM) NAIL INTERTROCHANTRIC  SURGEON:  Stacy Knickerbocker, MD  ASSISTANT:  None  OPERATIVE IMPLANTS: Synthes 78mm x 154mm 130 degree trochanteric femoral nail with interlocking helical blade into the femoral head.  PREOPERATIVE INDICATIONS:  [name] is a 81 y.o. year old who fell and suffered a hip fracture. She was brought into the ER and then admitted and optimized and then elected for surgical intervention.    The risks benefits and alternatives were discussed with the patient including but not limited to the risks of nonoperative treatment, versus surgical intervention including infection, bleeding, nerve injury, malunion, nonunion, hardware prominence, hardware failure, need for hardware removal, blood clots, cardiopulmonary complications, morbidity, mortality, among others, and they were willing to proceed.    OPERATIVE PROCEDURE:  The patient was brought to the operating room and placed in the supine position. Spinal anesthesia was administered, with a foley. She was placed on the fracture table.  Closed reduction was performed under C-arm guidance. The length of the femur was also measured using fluoroscopy. Time out was then performed after sterile prep and drape. She received preoperative antibiotics (600mg  clindamycin).  Incision was made proximal to the greater trochanter. A guidewire was placed in the appropriate position. Confirmation was made on AP and lateral views. The above-named nail was opened. I opened the proximal femur with a reamer. I then placed the nail by hand easily down. I did not need to ream the femur.  Once the nail was completely seated, I placed a guidepin into the femoral head into the center center position. I measured the length, and then reamed the lateral  cortex and up into the head. I then placed the helical blade. Slight compression was applied. Anatomic fixation achieved. Bone quality was mediocre.  I then secured the proximal interlocking bolt, and then placed a distal interlocking screw, and took final C-arm pictures AP and lateral the entire length of the implant. Anatomic reconstruction was achieved, and the wounds were irrigated copiously and closed with Vicryl followed by staples and sterile gauze for the skin. The patient was awakened and returned to PACU in stable and satisfactory condition. There no complications and the patient tolerated the procedure well.  She will be partial weightbearing for the next two weeks, and then WBAT. She will be on lovenox while in the hospital and converted to a daily aspirin upon discharge for a total of 4 weeks.

## 2015-02-02 NOTE — Anesthesia Procedure Notes (Signed)
Spinal Patient location during procedure: OR Start time: 02/02/2015 2:10 PM End time: 02/02/2015 2:30 PM Staffing Anesthesiologist: Martha Clan Performed by: anesthesiologist  Preanesthetic Checklist Completed: patient identified, site marked, surgical consent, pre-op evaluation, timeout performed, IV checked, risks and benefits discussed and monitors and equipment checked Spinal Block Patient position: sitting Prep: Betadine Patient monitoring: heart rate, continuous pulse ox, blood pressure and cardiac monitor Approach: midline Location: L4-5 Injection technique: single-shot Needle Needle type: Whitacre and Introducer  Needle gauge: 25 G Needle length: 9 cm Additional Notes Negative paresthesia. Negative blood return. Positive free-flowing CSF. Expiration date of kit checked and confirmed. Patient tolerated procedure well, without complications.

## 2015-02-02 NOTE — Anesthesia Preprocedure Evaluation (Addendum)
Anesthesia Evaluation  Patient identified by MRN, date of birth, ID band Patient awake    Reviewed: Allergy & Precautions, H&P , NPO status , Patient's Chart, lab work & pertinent test results, reviewed documented beta blocker date and time   History of Anesthesia Complications Negative for: history of anesthetic complications  Airway Mallampati: III  TM Distance: >3 FB Neck ROM: full    Dental no notable dental hx. (+) Teeth Intact, Chipped   Pulmonary neg pulmonary ROS,    Pulmonary exam normal breath sounds clear to auscultation       Cardiovascular Exercise Tolerance: Good negative cardio ROS Normal cardiovascular exam Rhythm:regular Rate:Normal     Neuro/Psych negative neurological ROS  negative psych ROS   GI/Hepatic negative GI ROS, Neg liver ROS,   Endo/Other  negative endocrine ROS  Renal/GU negative Renal ROS  negative genitourinary   Musculoskeletal   Abdominal   Peds  Hematology negative hematology ROS (+)   Anesthesia Other Findings Past Medical History:   Hearing loss                                                 Cancer (HCC)                                    skin         Reproductive/Obstetrics negative OB ROS                             Anesthesia Physical Anesthesia Plan  ASA: I  Anesthesia Plan: Spinal   Post-op Pain Management:    Induction:   Airway Management Planned:   Additional Equipment:   Intra-op Plan:   Post-operative Plan:   Informed Consent: I have reviewed the patients History and Physical, chart, labs and discussed the procedure including the risks, benefits and alternatives for the proposed anesthesia with the patient or authorized representative who has indicated his/her understanding and acceptance.   Dental Advisory Given  Plan Discussed with: Anesthesiologist, CRNA and Surgeon  Anesthesia Plan Comments:        Anesthesia  Quick Evaluation

## 2015-02-02 NOTE — Transfer of Care (Signed)
Immediate Anesthesia Transfer of Care Note  Patient: Bridget Nolan  Procedure(s) Performed: Procedure(s): INTRAMEDULLARY (IM) NAIL INTERTROCHANTRIC (Left)  Patient Location: PACU  Anesthesia Type:Spinal  Level of Consciousness: awake  Airway & Oxygen Therapy: Patient Spontanous Breathing  Post-op Assessment: Report given to RN  Post vital signs: Reviewed  Last Vitals:  Filed Vitals:   02/02/15 0418 02/02/15 0758  BP: 152/79 154/71  Pulse: 98 90  Temp: 36.6 C 37 C  Resp: 18 18    Complications: No apparent anesthesia complications

## 2015-02-02 NOTE — Progress Notes (Signed)
Patient arrived to unit from ED. Lhip Fx. Pain controlled with PRN medication. Patient alert and oriented. Skin CDI. Patient Bridget Nolan hearing aids. IV fluids infusing. NPO status mainted. Foley intact.

## 2015-02-03 ENCOUNTER — Encounter: Payer: Self-pay | Admitting: Orthopedic Surgery

## 2015-02-03 LAB — HEMOGLOBIN A1C: Hgb A1c MFr Bld: 5.5 % (ref 4.0–6.0)

## 2015-02-03 LAB — BASIC METABOLIC PANEL
ANION GAP: 5 (ref 5–15)
BUN: 12 mg/dL (ref 6–20)
CALCIUM: 8.3 mg/dL — AB (ref 8.9–10.3)
CO2: 27 mmol/L (ref 22–32)
CREATININE: 0.72 mg/dL (ref 0.44–1.00)
Chloride: 100 mmol/L — ABNORMAL LOW (ref 101–111)
GLUCOSE: 127 mg/dL — AB (ref 65–99)
Potassium: 3.7 mmol/L (ref 3.5–5.1)
Sodium: 132 mmol/L — ABNORMAL LOW (ref 135–145)

## 2015-02-03 LAB — CBC
HCT: 35.6 % (ref 35.0–47.0)
Hemoglobin: 12.1 g/dL (ref 12.0–16.0)
MCH: 32.7 pg (ref 26.0–34.0)
MCHC: 33.9 g/dL (ref 32.0–36.0)
MCV: 96.5 fL (ref 80.0–100.0)
PLATELETS: 154 10*3/uL (ref 150–440)
RBC: 3.69 MIL/uL — ABNORMAL LOW (ref 3.80–5.20)
RDW: 12.2 % (ref 11.5–14.5)
WBC: 7.9 10*3/uL (ref 3.6–11.0)

## 2015-02-03 NOTE — Progress Notes (Addendum)
Pt. Alert and oriented. VSS. POD 1. Pts. Pain controlled with pain meds per MAR. Pt. Dangled at the bedside. Foley patent. Neurochecks WDL. Resting quietly at this time. Will continue to monitor.

## 2015-02-03 NOTE — Anesthesia Post-op Follow-up Note (Signed)
  Anesthesia Pain Follow-up Note  Patient: Bridget Nolan  Day #: 1  Date of Follow-up: 02/03/2015 Time: 8:34 AM  Last Vitals:  Filed Vitals:   02/03/15 0533 02/03/15 0755  BP: 141/70 144/79  Pulse: 76 73  Temp: 36.7 C 36.6 C  Resp: 18 18    Level of Consciousness: alert  Pain: mild   Side Effects:None  Catheter Site Exam:clean, dry, no drainage  Plan: Continue current therapy and D/C from anesthesia care  Martha Clan

## 2015-02-03 NOTE — Progress Notes (Signed)
Pt. Dangled at the bedside. Tolerated it well but c/o severe pain in hip.

## 2015-02-03 NOTE — Progress Notes (Signed)
Pts. Foley d/c'd at 0620 with 350cc urine output

## 2015-02-03 NOTE — Evaluation (Cosign Needed)
Physical Therapy Evaluation Patient Details Name: Bridget Nolan MRN: [id number] DOB: July 05, 1921 Today's Date: 02/03/2015   History of Present Illness  Bridget Nolan is a 82 y.o. female with a known history of skin cancer unspecified, osteopenia who is presenting after mechanical fall. She describes suffering mechanical fall at her nursing facility . Found to have a left intertrochanteric femur fracture  Clinical Impression  Patient presents with 6/10 pain in LLE during mobility. She needs mod assist for bed mobility and transfers sit to stand with RW. She is not able to take more than 1 step due to inability to keep PWB precautions and push on AD for WB restrictions. She has 2/5 strength LLE . She will need SNF at [place] to reach her PLOF.    Follow Up Recommendations SNF    Equipment Recommendations  Rolling walker with 5" wheels    Recommendations for Other Services       Precautions / Restrictions Precautions Precautions: Fall Restrictions Weight Bearing Restrictions: Yes LLE Weight Bearing: Partial weight bearing      Mobility  Bed Mobility Overal bed mobility: Needs Assistance Bed Mobility: Supine to Sit     Supine to sit: Mod assist        Transfers Overall transfer level: Needs assistance Equipment used: Rolling walker (2 wheeled) Transfers: Sit to/from Stand Sit to Stand: Mod assist            Ambulation/Gait Ambulation/Gait assistance: Max assist Ambulation Distance (Feet): 1 Feet Assistive device: Rolling walker (2 wheeled) Gait Pattern/deviations: Step-to pattern     General Gait Details: antalgic  Stairs            Wheelchair Mobility    Modified Rankin (Stroke Patients Only)       Balance                                             Pertinent Vitals/Pain Pain Assessment: 0-10 Pain Score: 6  Pain Location: left leg Pain Intervention(s): Limited activity within patient's tolerance    Home Living                        Prior Function                 Hand Dominance        Extremity/Trunk Assessment                         Communication      Cognition Arousal/Alertness: Awake/alert Behavior During Therapy: WFL for tasks assessed/performed Overall Cognitive Status: Within Functional Limits for tasks assessed                      General Comments      Exercises General Exercises - Lower Extremity Ankle Circles/Pumps: AROM Quad Sets: AROM Gluteal Sets: AROM Short Arc Quad: AROM Hip ABduction/ADduction: AAROM Straight Leg Raises: AAROM      Assessment/Plan    PT Assessment    PT Diagnosis     PT Problem List    PT Treatment Interventions     PT Goals (Current goals can be found in the Care Plan section) Acute Rehab PT Goals Patient Stated Goal: Patient wants to walk    Frequency BID   Barriers to discharge  Co-evaluation               End of Session Equipment Utilized During Treatment: Gait belt Activity Tolerance: Patient limited by pain             Time: 1340-1410 PT Time Calculation (min) (ACUTE ONLY): 30 min   Charges:   PT Evaluation $Initial PT Evaluation Tier I: 1 Procedure PT Treatments $Therapeutic Exercise: 8-22 mins $Therapeutic Activity: 8-22 mins   PT G Codes:      Alanson Puls, PT, DPT  [place], Minette Headland S 02/03/2015, 3:08 PM

## 2015-02-03 NOTE — Progress Notes (Signed)
Patient POD 1. Patient demonstrates correct use of incentive spirometer with teach back. Patient weaned off O2. No acute distress noted.

## 2015-02-03 NOTE — Progress Notes (Addendum)
Paged Dr. Benjie Karvonen regarding patient diet. There are no orders to advance diet & patient would like some solid food rather than the clear liquid.

## 2015-02-03 NOTE — Progress Notes (Signed)
[facility] at [place] NAME: Bridget Nolan    MR#:  [id number]  DATE OF BIRTH:  20-Sep-1921  SUBJECTIVE:  Patient is soundly sleeping. No acute events overnight.   REVIEW OF SYSTEMS:    Review of Systems  Unable to perform ROS   Tolerating Diet: Yes       DRUG ALLERGIES:   Allergies  Allergen Reactions  . Penicillins Rash    Has patient had a PCN reaction causing immediate rash, facial/tongue/throat swelling, SOB or lightheadedness with hypotension: Yes Has patient had a PCN reaction causing severe rash involving mucus membranes or skin necrosis: No Has patient had a PCN reaction that required hospitalization Yes Has patient had a PCN reaction occurring within the last 10 years: No If all of the above answers are "NO", then may proceed with Cephalosporin use.    VITALS:  Blood pressure 144/79, pulse 73, temperature 97.8 F (36.6 C), temperature source Oral, resp. rate 18, height 5\' 3"  (1.6 m), weight 59.421 kg (131 lb), SpO2 97 %.  PHYSICAL EXAMINATION:   Physical Exam  Constitutional: She is well-developed, well-nourished, and in no distress. No distress.  HENT:  Head: Normocephalic.  Eyes: No scleral icterus.  Neck: Neck supple. No JVD present. No tracheal deviation present. Thyromegaly present.  Cardiovascular: Normal rate, regular rhythm and normal heart sounds.  Exam reveals no gallop and no friction rub.   No murmur heard. Pulmonary/Chest: Effort normal and breath sounds normal. No respiratory distress. She has no wheezes. She has no rales. She exhibits no tenderness.  Abdominal: Soft. Bowel sounds are normal. She exhibits no distension and no mass. There is no tenderness. There is no rebound and no guarding.  Musculoskeletal: Normal range of motion. She exhibits no edema.  Neurological:  Sleeping  Skin: Skin is warm. No rash noted. No erythema.      LABORATORY PANEL:   CBC  Recent Labs Lab  02/03/15 0412  WBC 7.9  HGB 12.1  HCT 35.6  PLT 154   ------------------------------------------------------------------------------------------------------------------  Chemistries   Recent Labs Lab 02/03/15 0412  NA 132*  K 3.7  CL 100*  CO2 27  GLUCOSE 127*  BUN 12  CREATININE 0.72  CALCIUM 8.3*   ------------------------------------------------------------------------------------------------------------------  Cardiac Enzymes No results for input(s): TROPONINI in the last 168 hours. ------------------------------------------------------------------------------------------------------------------  RADIOLOGY:  Dg Chest 1 View  02/01/2015  CLINICAL DATA:  Preop evaluation.  Femur fracture. EXAM: CHEST 1 VIEW COMPARISON:  06/08/2010 FINDINGS: Osteopenia. Patient rotated minimally left. Cardiomegaly accentuated by AP portable technique. Right hemidiaphragm elevation. No right-sided pleural effusion. No pneumothorax. Moderate pulmonary interstitial thickening. Suboptimal evaluation of the inferior left hemi thorax. IMPRESSION: Cardiomegaly. Pulmonary interstitial thickening is nonspecific in this age group and could be partially artifactual, secondary to AP portable technique. Suboptimal evaluation of the inferior lateral left hemi thorax. Increased density in this area is likely due to overlying soft tissues. If there are cardiopulmonary symptoms, recommend PA and lateral radiographs. Electronically Signed   By: Abigail Miyamoto M.D.   On: 02/01/2015 21:20   Dg Hip Operative Unilat With Pelvis Left  02/02/2015  CLINICAL DATA:  82 year old female with left hip fracture. Subsequent encounter. EXAM: OPERATIVE left HIP (WITH PELVIS IF PERFORMED) 4 VIEWS TECHNIQUE: Fluoroscopic spot image(s) were submitted for interpretation post-operatively. COMPARISON:  02/01/2015. FINDINGS: Post placement of sliding type screw transfixing the left intertrochanteric fracture without complication noted on  C-arm imaging. Follow-up two-view plain film exam can be obtained for  further delineation. IMPRESSION: Post open reduction and internal fixation left intertrochanteric fracture. Electronically Signed   By: Genia Del M.D.   On: 02/02/2015 15:53   Dg Hip Unilat With Pelvis 2-3 Views Left  02/01/2015  CLINICAL DATA:  Fall.  Initial encounter. EXAM: DG HIP (WITH OR WITHOUT PELVIS) 2-3V LEFT COMPARISON:  None. FINDINGS: Femoral heads are located. Mild osteopenia. Surgical clips projecting over the right hemipelvis. Markedly comminuted intratrochanteric left femur fracture. Varus angulation. IMPRESSION: Comminuted intratrochanteric left femur fracture. Electronically Signed   By: Abigail Miyamoto M.D.   On: 02/01/2015 21:17     ASSESSMENT AND PLAN:   82 year old female with a history of skin cancer who presented after mechanical fall and unfortunately has suffered a left femur fracture.   1. Left intertrochanteric femur fracture: Patient is  postoperative day #1 for intramedullary nail for the intertrochanteric fracture. DVT prophylaxis and pain control as per orthopedic.  2. Hyponatremia: Sodium at 132. Continue IV fluids for now and repeat sodium in a.m.   3. Hyperglycemia: Check hemoglobin A1c.  CODE STATUS:  DO NOT RESUSCITATE  TOTAL TIME TAKING CARE OF THIS PATIENT:  25 minutes.     POSSIBLE D/C  2 days, DEPENDING ON CLINICAL CONDITION.   Jolene Guyett M.D on 02/03/2015 at 9:01 AM  Between 7am to 6pm - Pager - [phone] After 6pm go to [email or web address] - password [email or web address] [facility]  Office  [phone]  CC: Primary care physician; Maryland Pink, MD  Note: This dictation was prepared with Dragon dictation along with smaller phrase technology. Any transcriptional errors that result from this process are unintentional.

## 2015-02-03 NOTE — Anesthesia Postprocedure Evaluation (Signed)
Anesthesia Post Note  Patient: AUNE ARIEL  Procedure(s) Performed: Procedure(s) (LRB): INTRAMEDULLARY (IM) NAIL INTERTROCHANTRIC (Left)  Patient location during evaluation: PACU Anesthesia Type: Spinal Level of consciousness: awake and alert Pain management: pain level controlled Vital Signs Assessment: post-procedure vital signs reviewed and stable Respiratory status: spontaneous breathing, nonlabored ventilation, respiratory function stable and patient connected to nasal cannula oxygen Cardiovascular status: blood pressure returned to baseline and stable Postop Assessment: no signs of nausea or vomiting Anesthetic complications: no    Last Vitals:  Filed Vitals:   02/03/15 0533 02/03/15 0755  BP: 141/70 144/79  Pulse: 76 73  Temp: 36.7 C 36.6 C  Resp: 18 18    Last Pain:  Filed Vitals:   02/03/15 0755  PainSc: 1                  Martha Clan

## 2015-02-03 NOTE — Progress Notes (Signed)
POD #1 s/p left proximal femur short TFN. Patientis awake and alert this AM in no acute distress. She denies any hip pain.   Wt Readings from Last 3 Encounters:  02/02/15 59.421 kg (131 lb)  02/15/13 56.7 kg (125 lb)   Temp Readings from Last 3 Encounters:  02/03/15 97.8 F (36.6 C)    BP Readings from Last 3 Encounters:  02/03/15 144/79  02/15/13 149/89   Pulse Readings from Last 3 Encounters:  02/03/15 73  02/15/13 93   Hip dressing is C/D/I. She is NVID to BLE.   A/P: POD#1 s/p IMN left proximal femur - PT, 50% WB to LLE for 2-3 weeks, then progress to WBAT - D/C planning

## 2015-02-03 NOTE — Care Management Note (Signed)
Case Management Note  Patient Details  Name: EZORA THIGPEN MRN: [id number] Date of Birth: [birth year]  Subjective/Objective:     82yo Mrs Inta Suski was admitted on 02/01/15 with a left femour fracture after a fall at home. Surgery on 02/02/15. Resides  alone at [facility]. PCP=Dr Maryland Pink. Pharmacy = [facility]. [facility]-PT is recommending SNF for Rehab. Referral to social work for placement.               Action/Plan:   Expected Discharge Date:                  Expected Discharge Plan:     In-House Referral:     Discharge planning Services     Post Acute Care Choice:    Choice offered to:     DME Arranged:    DME Agency:     HH Arranged:    [place] Agency:     Status of Service:     Medicare Important Message Given:    Date Medicare IM Given:    Medicare IM give by:    Date Additional Medicare IM Given:    Additional Medicare Important Message give by:     If discussed at [place] of Stay Meetings, dates discussed:    Additional Comments:  Tanashia Ciesla A, RN 02/03/2015, 4:11 PM

## 2015-02-03 NOTE — Progress Notes (Signed)
Paged Dr. Benjie Karvonen b/c there were no orders for a regular diet, only clear liquids.  Per Dr. Benjie Karvonen, okay to advance diet to regular.

## 2015-02-04 LAB — CBC
HEMATOCRIT: 33.8 % — AB (ref 35.0–47.0)
HEMOGLOBIN: 11.7 g/dL — AB (ref 12.0–16.0)
MCH: 34 pg (ref 26.0–34.0)
MCHC: 34.6 g/dL (ref 32.0–36.0)
MCV: 98.2 fL (ref 80.0–100.0)
Platelets: 143 10*3/uL — ABNORMAL LOW (ref 150–440)
RBC: 3.44 MIL/uL — AB (ref 3.80–5.20)
RDW: 12.3 % (ref 11.5–14.5)
WBC: 7.3 10*3/uL (ref 3.6–11.0)

## 2015-02-04 LAB — BASIC METABOLIC PANEL
Anion gap: 5 (ref 5–15)
BUN: 14 mg/dL (ref 6–20)
CHLORIDE: 104 mmol/L (ref 101–111)
CO2: 27 mmol/L (ref 22–32)
CREATININE: 0.65 mg/dL (ref 0.44–1.00)
Calcium: 8.5 mg/dL — ABNORMAL LOW (ref 8.9–10.3)
GFR calc Af Amer: >60 mL/min (ref >60)
GLUCOSE: 121 mg/dL — AB (ref 65–99)
POTASSIUM: 4 mmol/L (ref 3.5–5.1)
Sodium: 136 mmol/L (ref 135–145)

## 2015-02-04 MED ORDER — ACETAMINOPHEN 325 MG PO TABS: 650.0000 mg | ORAL_TABLET | Freq: Four times a day (QID) | ORAL | Status: DC | PRN

## 2015-02-04 MED ORDER — DOCUSATE SODIUM 100 MG PO CAPS: 100.0000 mg | ORAL_CAPSULE | Freq: Two times a day (BID) | ORAL | Status: DC

## 2015-02-04 MED ORDER — ENOXAPARIN SODIUM 40 MG/0.4ML ~~LOC~~ SOLN: 40.0000 mg | SUBCUTANEOUS | Status: DC

## 2015-02-04 NOTE — Discharge Summary (Signed)
[facility] [place] NAME: Bridget Nolan    MR#:  [id number]  DATE OF BIRTH:  1921-12-12  DATE OF ADMISSION:  02/01/2015 ADMITTING PHYSICIAN: Claud Kelp, MD  DATE OF DISCHARGE: 02/04/2015  PRIMARY CARE PHYSICIAN: Maryland Pink, MD    ADMISSION DIAGNOSIS:  Pre-op evaluation [Z01.818] Closed fracture of left femur, unspecified fracture morphology, initial encounter ([place]) [S72.92XA]  DISCHARGE DIAGNOSIS:  Active Problems:   Intertrochanteric fracture of left femur ([place])   SECONDARY DIAGNOSIS:   Past Medical History  Diagnosis Date  . Hearing loss   . Cancer [facility]) skin    HOSPITAL COURSE:   82 year old female with a history of skin cancer who presented after mechanical fall and unfortunately has suffered a left femur fracture.   1. Left intertrochanteric femur fracture: Patient is postoperative day #2 for intramedullary nail for the intertrochanteric fracture. DVT prophylaxis and pain control as per orthopedic. She will need Lovenox for 14 days postop. Plan for skilled nursing facility today. Patient is not taking narcotics for pain control only Tylenol.  2. Hyponatremia: Improved with IV fluids. This is likely due to hypovolemic hyponatremia. 3. Hyperglycemia: Hemoglobin A1c is 5.5. Patient does not have diabetes.       DISCHARGE CONDITIONS AND DIET:   Patient is stable for discharge to skilled nursing facility on a regular diet  CONSULTS OBTAINED:  Treatment Team:  Claud Kelp, MD Lytle Butte, MD Dereck Leep, MD  DRUG ALLERGIES:   Allergies  Allergen Reactions  . Penicillins Rash    Has patient had a PCN reaction causing immediate rash, facial/tongue/throat swelling, SOB or lightheadedness with hypotension: Yes Has patient had a PCN reaction causing severe rash involving mucus membranes or skin necrosis: No Has patient had a PCN reaction that required hospitalization Yes Has patient had a PCN  reaction occurring within the last 10 years: No If all of the above answers are "NO", then may proceed with Cephalosporin use.    DISCHARGE MEDICATIONS:   Current Discharge Medication List    START taking these medications   Details  acetaminophen (TYLENOL) 325 MG tablet Take 2 tablets (650 mg total) by mouth every 6 (six) hours as needed for mild pain (or Fever >/= 101). Qty: 30 tablet, Refills: 0    docusate sodium (COLACE) 100 MG capsule Take 1 capsule (100 mg total) by mouth 2 (two) times daily. Qty: 10 capsule, Refills: 0    enoxaparin (LOVENOX) 40 MG/0.4ML injection Inject 0.4 mLs (40 mg total) into the skin daily. Qty: 14 mL, Refills: 0      CONTINUE these medications which have NOT CHANGED   Details  BIOTIN PO Take by mouth daily.    cholecalciferol (VITAMIN D) 400 units TABS tablet Take 400 Units by mouth daily.    Multiple Vitamin (MULTIVITAMIN WITH MINERALS) TABS tablet Take 1 tablet by mouth daily.              Today   CHIEF COMPLAINT:   Patient doing well this am   VITAL SIGNS:  Blood pressure 148/67, pulse 86, temperature 98.5 F (36.9 C), temperature source Oral, resp. rate 18, height 5\' 3"  (1.6 m), weight 59.421 kg (131 lb), SpO2 92 %.   REVIEW OF SYSTEMS:  Review of Systems  Constitutional: Negative for fever, chills and malaise/fatigue.  HENT: Negative for sore throat.   Eyes: Negative for blurred vision.  Respiratory: Negative for cough, hemoptysis, shortness of breath and wheezing.  Cardiovascular: Negative for chest pain, palpitations and leg swelling.  Gastrointestinal: Negative for nausea, vomiting, abdominal pain, diarrhea and blood in stool.  Genitourinary: Negative for dysuria.  Musculoskeletal: Negative for back pain.  Neurological: Negative for dizziness, tremors and headaches.  Endo/Heme/Allergies: Does not bruise/bleed easily.     PHYSICAL EXAMINATION:  GENERAL:  82 y.o.-year-old patient lying in the bed with no acute  distress.  NECK:  Supple, no jugular venous distention. No thyroid enlargement, no tenderness.  LUNGS: Normal breath sounds bilaterally, no wheezing, rales,rhonchi  No use of accessory muscles of respiration.  CARDIOVASCULAR: S1, S2 normal. No murmurs, rubs, or gallops.  ABDOMEN: Soft, non-tender, non-distended. Bowel sounds present. No organomegaly or mass.  EXTREMITIES: No pedal edema, cyanosis, or clubbing.  PSYCHIATRIC: The patient is alert and oriented x 3.  SKIN: No obvious rash, lesion, or ulcer.   DATA REVIEW:   CBC  Recent Labs Lab 02/04/15 0426  WBC 7.3  HGB 11.7*  HCT 33.8*  PLT 143*    Chemistries   Recent Labs Lab 02/04/15 0426  NA 136  K 4.0  CL 104  CO2 27  GLUCOSE 121*  BUN 14  CREATININE 0.65  CALCIUM 8.5*    Cardiac Enzymes No results for input(s): TROPONINI in the last 168 hours.  Microbiology Results  @[email or web address] @  RADIOLOGY:  Dg Hip Operative Unilat With Pelvis Left  02/02/2015  CLINICAL DATA:  82 year old female with left hip fracture. Subsequent encounter. EXAM: OPERATIVE left HIP (WITH PELVIS IF PERFORMED) 4 VIEWS TECHNIQUE: Fluoroscopic spot image(s) were submitted for interpretation post-operatively. COMPARISON:  02/01/2015. FINDINGS: Post placement of sliding type screw transfixing the left intertrochanteric fracture without complication noted on C-arm imaging. Follow-up two-view plain film exam can be obtained for further delineation. IMPRESSION: Post open reduction and internal fixation left intertrochanteric fracture. Electronically Signed   By: Genia Del M.D.   On: 02/02/2015 15:53      Management plans discussed with the patient and she is in agreement. Stable for discharge SNF  Patient should follow up with ORTHO in 10 days  CODE STATUS:     Code Status Orders        Start     Ordered   02/01/15 2245  Do not attempt resuscitation (DNR)   Continuous    Question Answer Comment  In the event of cardiac or respiratory  ARREST Do not call a "code blue"   In the event of cardiac or respiratory ARREST Do not perform Intubation, CPR, defibrillation or ACLS   In the event of cardiac or respiratory ARREST Use medication by any route, position, wound care, and other measures to relive pain and suffering. May use oxygen, suction and manual treatment of airway obstruction as needed for comfort.      02/01/15 2245    Advance Directive Documentation        Most Recent Value   Type of Advance Directive  Out of facility DNR (pink MOST or yellow form)   Pre-existing out of facility DNR order (yellow form or pink MOST form)  Yellow form placed in chart (order not valid for inpatient use), Physician notified to receive inpatient order   "MOST" Form in Place?        TOTAL TIME TAKING CARE OF THIS PATIENT: 35 minutes.    Note: This dictation was prepared with Dragon dictation along with smaller phrase technology. Any transcriptional errors that result from this process are unintentional.  Tahj Lindseth M.D on 02/04/2015 at 11:28 AM  Between 7am to 6pm - Pager - [phone] After 6pm go to [email or web address] - password [email or web address] [facility]  [facility]  Office  [phone]  CC: Primary care physician; Maryland Pink, MD

## 2015-02-04 NOTE — Discharge Instructions (Signed)
Change dressing left hip as needed with island dressing  Partial weight bearing left leg

## 2015-02-04 NOTE — Care Management Important Message (Signed)
Important Message  Patient Details  Name: GRACIN ABRAMYAN MRN: [id number] Date of Birth: 08-Apr-1921   Medicare Important Message Given:  Yes    Laneshia Pina A, RN 02/04/2015, 8:42 AM

## 2015-02-04 NOTE — Progress Notes (Signed)
PROGRESS NOTE  PATIENT NAME: Bridget Nolan DOB: Apr 22, 1921  MRN: [id number]   Subjective: Patient eating well and went to the room today she is feeling well overall and has no issues. She did stand with physical therapy and for partial weightbearing on her leg as indicated. Pain is well-controlled. She is to be discharged today to a skilled nursing facility.    Objective: Vital signs in last 24 hours: Temp:  [97.6 F (36.4 C)-98.9 F (37.2 C)] 98.5 F (36.9 C) (01/02 0833) Pulse Rate:  [80-90] 86 (01/02 1000) Resp:  [18] 18 (01/02 0833) BP: (124-148)/(60-67) 148/67 mmHg (01/02 0833) SpO2:  [90 %-97 %] 92 % (01/02 1000)  Intake/Output from previous day: 01/01 0701 - 01/02 0700 In: 2832.5 [P.O.:1080; I.V.:1752.5] Out: 1025 [Urine:1025]   Recent Labs  02/01/15 2208 02/02/15 0326 02/03/15 0412 02/04/15 0426  WBC 11.0 9.2 7.9 7.3  HGB 13.5 12.8 12.1 11.7*  HCT 39.2 37.4 35.6 33.8*  PLT 200 184 154 143*  K 3.3* 4.1 3.7 4.0  CL 107 106 100* 104  CO2 24 25 27 27   BUN 23* 18 12 14   CREATININE 0.89 0.72 0.72 0.65  GLUCOSE 171* 133* 127* 121*  CALCIUM 9.1 8.8* 8.3* 8.5*  INR 0.99  --   --   --     EXAM - left lower extremity General - patient is resting comfortably Incisions are benign with no evidence of significant drainage at this time. Patient able to dorsiflex and plantarflex her feet with no difficulty. Pulses intact distally 2+ Neurovascular intact distally with sensation intact.  Assessment: Status post short TFN nailing of the left hip/intertrochanteric fracture  Plan: Patient will be discharged to a skilled nursing facility today. Lovenox for DVT chemoprophylaxis Oxycodone for pain control Partial weightbearing as indicated by Dr. Daine Gip Follow-up in 6 weeks Staples may be removed at outpatient facility. Juliene Pina , MD

## 2015-02-04 NOTE — Progress Notes (Signed)
   Subjective: 2 Days Post-Op Procedure(s) (LRB): INTRAMEDULLARY (IM) NAIL INTERTROCHANTRIC (Left) Patient reports pain as mild.   Patient is well, and has had no acute complaints or problems Continue with physical therapy today.  Plan is to go Rehab after hospital stay. no nausea and no vomiting Patient denies any chest pains or shortness of breath. Objective: Vital signs in last 24 hours: Temp:  [97.6 F (36.4 C)-98.9 F (37.2 C)] 98.9 F (37.2 C) (01/02 0438) Pulse Rate:  [73-86] 86 (01/02 0438) Resp:  [18] 18 (01/02 0438) BP: (124-144)/(60-79) 141/63 mmHg (01/02 0438) SpO2:  [90 %-97 %] 90 % (01/02 0438) well approximated incision Heels are non tender and elevated off the bed using rolled towels Intake/Output from previous day: 01/01 0701 - 01/02 0700 In: 2832.5 [P.O.:1080; I.V.:1752.5] Out: 1025 [Urine:1025] Intake/Output this shift:     Recent Labs  02/01/15 2208 02/02/15 0326 02/03/15 0412 02/04/15 0426  HGB 13.5 12.8 12.1 11.7*    Recent Labs  02/03/15 0412 02/04/15 0426  WBC 7.9 7.3  RBC 3.69* 3.44*  HCT 35.6 33.8*  PLT 154 143*    Recent Labs  02/03/15 0412 02/04/15 0426  NA 132* 136  K 3.7 4.0  CL 100* 104  CO2 27 27  BUN 12 14  CREATININE 0.72 0.65  GLUCOSE 127* 121*  CALCIUM 8.3* 8.5*    Recent Labs  02/01/15 2208  INR 0.99    EXAM General - Patient is Alert, Appropriate and Oriented Extremity - Neurologically intact Neurovascular intact Sensation intact distally Intact pulses distally Dorsiflexion/Plantar flexion intact Dressing - moderate drainage Motor Function - intact, moving foot and toes well on exam.    Past Medical History  Diagnosis Date  . Hearing loss   . Cancer ([place]) skin    Assessment/Plan: 2 Days Post-Op Procedure(s) (LRB): INTRAMEDULLARY (IM) NAIL INTERTROCHANTRIC (Left) Active Problems:   Intertrochanteric fracture of left femur ([facility])  Estimated body mass index is 23.21 kg/(m^2) as calculated from  the following:   Height as of this encounter: 5\' 3"  (1.6 m).   Weight as of this encounter: 59.421 kg (131 lb). Up with therapy Plan for discharge tomorrow Discharge to SNF  Labs: Were reviewed DVT Prophylaxis - Lovenox, TED hose and Compression leg wraps Weight-Bearing as tolerated to left leg Patient needs to have a bowel movement today Dressing changed   [place] Landgrebe R. [place] 02/04/2015, 7:33 AM

## 2015-02-04 NOTE — Progress Notes (Signed)
Pt up in chair tol well pt was one assist to bedside commode and then to bed , pt voiding and had medium formed stool  Report called to [place] at [facility]

## 2015-02-04 NOTE — Progress Notes (Signed)
Physical Therapy Treatment Patient Details Name: Bridget Nolan MRN: [id number] DOB: [birth year] Today's Date: 02/04/2015    History of Present Illness Bridget Nolan is a 82 y.o. female with a known history of skin cancer unspecified, osteopenia who is presenting after mechanical fall. She describes suffering mechanical fall at her nursing facility . Found to have a left intertrochanteric femur fracture    PT Comments    Pt agreeable to PT and up out of bed. Participates well in supine and seated exercises. No pain complaints in supine; with movement and stand/ambulation, pain increases to 8/10 as described by faces scale. Pt requires Min A today for bed mobility and transfers; Min guard and cues for sequence/chair approach with ambulation. Pt comfortable up in chair and educated on calling for assist before getting out of chair; pt understands. Encourage pt to remain in chair as long as tolerated. Pt also encouraged to use incentive spirometer for improved lung capacity and O2 saturation level; pt demonstrates use. Plan to see pt this afternoon for continue work on strengthening, transfers, safety and ambulation.   Follow Up Recommendations  SNF     Equipment Recommendations  Rolling walker with 5" wheels    Recommendations for Other Services       Precautions / Restrictions Precautions Precautions: Fall Restrictions Weight Bearing Restrictions: Yes LLE Weight Bearing: Partial weight bearing    Mobility  Bed Mobility Overal bed mobility: Needs Assistance Bed Mobility: Supine to Sit     Supine to sit: Min assist     General bed mobility comments: Min A for LLE and trunk with increased time.   Transfers Overall transfer level: Needs assistance Equipment used: Rolling walker (2 wheeled) Transfers: Sit to/from Stand Sit to Stand: Min assist         General transfer comment: Compliant with L PWB; cues for safe hand placement  Ambulation/Gait Ambulation/Gait assistance:  Min guard Ambulation Distance (Feet): 4 Feet Assistive device: Rolling walker (2 wheeled) Gait Pattern/deviations: Step-to pattern;Decreased step length - right;Decreased step length - left;Decreased stance time - left;Decreased dorsiflexion - right;Decreased dorsiflexion - left;Decreased weight shift to left;Antalgic (PWB on L) Gait velocity: decreased Gait velocity interpretation: <1.8 ft/sec, indicative of risk for recurrent falls General Gait Details: Effortful, but good compliance with L PWB, slow, small steps; cues for safe chair approach and sit.    Stairs            Wheelchair Mobility    Modified Rankin (Stroke Patients Only)       Balance                                    Cognition Arousal/Alertness: Awake/alert Behavior During Therapy: WFL for tasks assessed/performed Overall Cognitive Status: Within Functional Limits for tasks assessed                      Exercises General Exercises - Lower Extremity Ankle Circles/Pumps: AROM;Both;20 reps;Supine Quad Sets: Strengthening;20 reps;Supine;Both Gluteal Sets: Strengthening;Both;20 reps;Supine Short Arc Quad: AROM;Left;20 reps;Supine Long Arc Quad: AROM;Left;Seated;10 reps (2 sets) Heel Slides: AAROM;Left;20 reps;Supine Hip ABduction/ADduction: AAROM;Left;20 reps;Supine Straight Leg Raises: AAROM;Left;20 reps;Supine Hip Flexion/Marching: AAROM;10 reps;Seated;Left (2 sets)    General Comments        Pertinent Vitals/Pain Pain Assessment: Faces Faces Pain Scale: Hurts whole lot (with stand/ambulation) Pain Location: LLE Pain Descriptors / Indicators: Aching Pain Intervention(s): Monitored during session;Repositioned    Home Living  Prior Function            PT Goals (current goals can now be found in the care plan section) Progress towards PT goals: Progressing toward goals    Frequency  BID    PT Plan Current plan remains appropriate     Co-evaluation             End of Session Equipment Utilized During Treatment: Gait belt Activity Tolerance: Patient limited by fatigue;Patient limited by pain;Patient tolerated treatment well Patient left: in chair;with call bell/phone within reach;with nursing/sitter in room;with SCD's reapplied (nurse/aide awaiting pad for alarm; Pt knows to call for A )     Time: 1000-1030 PT Time Calculation (min) (ACUTE ONLY): 30 min  Charges:  $Gait Training: 8-22 mins $Therapeutic Exercise: 8-22 mins                    G Codes:      [name] 02/04/2015, 11:05 AM

## 2015-02-04 NOTE — Progress Notes (Signed)
Pt discharged via EMS

## 2015-02-04 NOTE — Progress Notes (Signed)
[place] at [place] NAME: Bridget Nolan    MR#:  [id number]  DATE OF BIRTH:  [birth year]  SUBJECTIVE:  Patient doing well this morning. No acute events overnight.  REVIEW OF SYSTEMS:    Review of Systems  Constitutional: Negative for fever, chills and malaise/fatigue.  HENT: Negative for sore throat.   Eyes: Negative for blurred vision.  Respiratory: Negative for cough, hemoptysis, shortness of breath and wheezing.   Cardiovascular: Negative for chest pain, palpitations and leg swelling.  Gastrointestinal: Negative for nausea, vomiting, abdominal pain, diarrhea and blood in stool.  Genitourinary: Negative for dysuria.  Musculoskeletal: Negative for back pain.  Neurological: Negative for dizziness, tremors and headaches.  Endo/Heme/Allergies: Does not bruise/bleed easily.    Tolerating Diet: Yes       DRUG ALLERGIES:   Allergies  Allergen Reactions  . Penicillins Rash    Has patient had a PCN reaction causing immediate rash, facial/tongue/throat swelling, SOB or lightheadedness with hypotension: Yes Has patient had a PCN reaction causing severe rash involving mucus membranes or skin necrosis: No Has patient had a PCN reaction that required hospitalization Yes Has patient had a PCN reaction occurring within the last 10 years: No If all of the above answers are "NO", then may proceed with Cephalosporin use.    VITALS:  Blood pressure 148/67, pulse 90, temperature 98.5 F (36.9 C), temperature source Oral, resp. rate 18, height 5\' 3"  (1.6 m), weight 59.421 kg (131 lb), SpO2 94 %.  PHYSICAL EXAMINATION:   Physical Exam  Constitutional: She is oriented to person, place, and time and well-developed, well-nourished, and in no distress. No distress.  HENT:  Head: Normocephalic.  Eyes: No scleral icterus.  Neck: Normal range of motion. Neck supple. No JVD present. No tracheal deviation present.  Cardiovascular: Normal rate,  regular rhythm and normal heart sounds.  Exam reveals no gallop and no friction rub.   No murmur heard. Pulmonary/Chest: Effort normal and breath sounds normal. No respiratory distress. She has no wheezes. She has no rales. She exhibits no tenderness.  Abdominal: Soft. Bowel sounds are normal. She exhibits no distension and no mass. There is no tenderness. There is no rebound and no guarding.  Musculoskeletal: Normal range of motion. She exhibits no edema.  Neurological: She is alert and oriented to person, place, and time.  Skin: Skin is warm. No rash noted. No erythema.  Psychiatric: Affect and judgment normal.      LABORATORY PANEL:   CBC  Recent Labs Lab 02/04/15 0426  WBC 7.3  HGB 11.7*  HCT 33.8*  PLT 143*   ------------------------------------------------------------------------------------------------------------------  Chemistries   Recent Labs Lab 02/04/15 0426  NA 136  K 4.0  CL 104  CO2 27  GLUCOSE 121*  BUN 14  CREATININE 0.65  CALCIUM 8.5*   ------------------------------------------------------------------------------------------------------------------  Cardiac Enzymes No results for input(s): TROPONINI in the last 168 hours. ------------------------------------------------------------------------------------------------------------------  RADIOLOGY:  Dg Hip Operative Unilat With Pelvis Left  02/02/2015  CLINICAL DATA:  82 year old female with left hip fracture. Subsequent encounter. EXAM: OPERATIVE left HIP (WITH PELVIS IF PERFORMED) 4 VIEWS TECHNIQUE: Fluoroscopic spot image(s) were submitted for interpretation post-operatively. COMPARISON:  02/01/2015. FINDINGS: Post placement of sliding type screw transfixing the left intertrochanteric fracture without complication noted on C-arm imaging. Follow-up two-view plain film exam can be obtained for further delineation. IMPRESSION: Post open reduction and internal fixation left intertrochanteric fracture.  Electronically Signed   By: Alcide Evener.D.  On: 02/02/2015 15:53     ASSESSMENT AND PLAN:   82 year old female with a history of skin cancer who presented after mechanical fall and unfortunately has suffered a left femur fracture.   1. Left intertrochanteric femur fracture: Patient is  postoperative day #2 for intramedullary nail for the intertrochanteric fracture. DVT prophylaxis and pain control as per orthopedic. Plan for skilled nursing facility tomorrow.  2. Hyponatremia: Improved with IV fluids. This is likely due to hypovolemic hyponatremia. 3. Hyperglycemia: Hemoglobin A1c is 5.5. Patient does not have diabetes. CODE STATUS:  DO NOT RESUSCITATE  TOTAL TIME TAKING CARE OF THIS PATIENT:  25 minutes.     POSSIBLE D/C  tomorrow DEPENDING ON CLINICAL CONDITION to skilled nursing facility.   Devery Odwyer M.D on 02/04/2015 at 10:16 AM  Between 7am to 6pm - Pager - [phone] After 6pm go to [email or web address] - password [email or web address] [facility] [facility]  Office  [phone]  CC: Primary care physician; Maryland Pink, MD  Note: This dictation was prepared with Dragon dictation along with smaller phrase technology. Any transcriptional errors that result from this process are unintentional.

## 2015-02-04 NOTE — Progress Notes (Signed)
rn spoke with dr Jerline Pain re: pt has bed at [facility]. Pod2. Condition stable.md reports pt is cleared for discharge per  Orthopedic standpoint

## 2015-02-04 NOTE — NC FL2 (Signed)
[place] LEVEL OF CARE SCREENING TOOL     IDENTIFICATION  Patient Name: Bridget Nolan Birthdate: 09/05/1921 Sex: female Admission Date (Current Location): 02/01/2015  Princess Ariely and [place] Number:  Engineering geologist and Address:  [facility], [address], [place], [place] [address]      Provider Number: [id number]  Attending Physician Name and Address:  Bettey Costa, MD  Relative Name and Phone Number:       Current Level of Care: Hospital Recommended Level of Care: [place] Prior Approval Number:    Date Approved/Denied:   PASRR Number: [id number]  Discharge Plan: Home    Current Diagnoses: Patient Active Problem List   Diagnosis Date Noted  . Intertrochanteric fracture of left femur ([place]) 02/01/2015    Orientation RESPIRATION BLADDER Height & Weight    Self, Time, Situation  Normal Continent 5\' 3"  (160 cm) 131 lbs.  BEHAVIORAL SYMPTOMS/MOOD NEUROLOGICAL BOWEL NUTRITION STATUS      Continent Diet (Regular)  AMBULATORY STATUS COMMUNICATION OF NEEDS Skin   Limited Assist Verbally Normal                       Personal Care Assistance Level of Assistance  Bathing, Feeding, Dressing Bathing Assistance: Limited assistance Feeding assistance: Limited assistance Dressing Assistance: Limited assistance     Functional Limitations Info  Sight, Hearing, Speech Sight Info: Impaired Hearing Info: Impaired Speech Info: Impaired    SPECIAL CARE FACTORS FREQUENCY  PT (By licensed PT)     PT Frequency: 5              Contractures      Additional Factors Info  Code Status, Allergies Code Status Info: DNR Allergies Info: Penicillins           Current Medications (02/04/2015):  This is the current hospital active medication list Current Facility-Administered Medications  Medication Dose Route Frequency Provider Last Rate Last Dose  . 0.9 %  sodium chloride infusion   Intravenous Continuous  Lytle Butte, MD 75 mL/hr at 02/03/15 0520    . acetaminophen (TYLENOL) tablet 650 mg  650 mg Oral Q6H PRN Lytle Butte, MD   650 mg at 02/02/15 1832   Or  . acetaminophen (TYLENOL) suppository 650 mg  650 mg Rectal Q6H PRN Lytle Butte, MD      . cholecalciferol (VITAMIN D) tablet 400 Units  400 Units Oral Daily Lytle Butte, MD   400 Units at 02/04/15 [id number]  . docusate sodium (COLACE) capsule 100 mg  100 mg Oral BID Lytle Butte, MD   100 mg at 02/04/15 [id number]  . enoxaparin (LOVENOX) injection 40 mg  40 mg Subcutaneous Q24H Claud Kelp, MD   40 mg at 02/04/15 0811  . morphine 2 MG/ML injection 2 mg  2 mg Intravenous Q4H PRN Lytle Butte, MD   2 mg at 02/03/15 0104  . multivitamin with minerals tablet 1 tablet  1 tablet Oral Daily Lytle Butte, MD   1 tablet at 02/04/15 0810  . ondansetron (ZOFRAN) tablet 4 mg  4 mg Oral Q6H PRN Lytle Butte, MD       Or  . ondansetron [facility]) injection 4 mg  4 mg Intravenous Q6H PRN Lytle Butte, MD      . oxyCODONE (Oxy IR/ROXICODONE) immediate release tablet 5 mg  5 mg Oral Q4H PRN Lytle Butte, MD   5 mg at  02/02/15 2350     Discharge Medications: Please see discharge summary for a list of discharge medications.  Relevant Imaging Results:  Relevant Lab Results:   Additional Information SSN:  [id number]  Darden Dates, LCSW

## 2015-02-04 NOTE — Progress Notes (Signed)
Physical Therapy Treatment Patient Details Name: Bridget Nolan MRN: [id number] DOB: [birth year] Today's Date: 02/04/2015    History of Present Illness Bridget Nolan is a 82 y.o. female with a known history of skin cancer unspecified, osteopenia who is presenting after mechanical fall. She describes suffering mechanical fall at her nursing facility . Found to have a left intertrochanteric femur fracture    PT Comments    Pt has questions regarding weightbearing status on left lower extremity; explained weightbearing status as it is ordered currently and anticipated weightbearing status 2 weeks post surgery. Pt states understanding. Pt remained in chair all morning; requests back to bed to use bed pan. Pt encouraged to ambulate to bedside commode and complies. Requires Min guard to Min A; pt attempts to sit too early on commode requiring additional assist to regain balance and continue stepping. Pt requires assist post toileting for stand tolerance and personal hygiene by nursing. Pt ambulates several steps to bed. Mod A to return to bed. Pt to be discharged to skilled nursing facility today to continue strengthening, progression of transfers, safety and ambulation to improve all functional mobility.   Follow Up Recommendations  SNF     Equipment Recommendations  Rolling walker with 5" wheels    Recommendations for Other Services       Precautions / Restrictions Precautions Precautions: Fall Restrictions Weight Bearing Restrictions: Yes LLE Weight Bearing: Partial weight bearing    Mobility  Bed Mobility Overal bed mobility: Needs Assistance Bed Mobility: Sit to Supine     Supine to sit: Min assist Sit to supine: Mod assist   General bed mobility comments: Mod A for LEs; +2 to reposition upward in bed  Transfers Overall transfer level: Needs assistance Equipment used: Rolling walker (2 wheeled) Transfers: Sit to/from Stand Sit to Stand: Min assist (cues for hands)          General transfer comment: Compliant with L PWB; cues for safe hand placement  Ambulation/Gait Ambulation/Gait assistance: Min assist;Min guard Ambulation Distance (Feet): 3 Feet (to bedside commode; then several steps to bed) Assistive device: Rolling walker (2 wheeled) Gait Pattern/deviations: Step-to pattern;Antalgic;Trunk flexed Gait velocity: decreased Gait velocity interpretation: <1.8 ft/sec, indicative of risk for recurrent falls General Gait Details: More effortful this afternoon; requires increased assist when pt attempts to sit too early on bedside commode for balance   Stairs            Wheelchair Mobility    Modified Rankin (Stroke Patients Only)       Balance                                    Cognition Arousal/Alertness: Awake/alert Behavior During Therapy: WFL for tasks assessed/performed Overall Cognitive Status: Within Functional Limits for tasks assessed                      Exercises General Exercises - Lower Extremity Ankle Circles/Pumps: AROM;Both;20 reps;Supine Quad Sets: Strengthening;20 reps;Supine;Both Gluteal Sets: Strengthening;Both;20 reps;Supine Short Arc Quad: AROM;Left;20 reps;Supine Long Arc Quad: AROM;Left;Seated;10 reps (2 sets) Heel Slides: AAROM;Left;20 reps;Supine Hip ABduction/ADduction: AAROM;Left;20 reps;Supine Straight Leg Raises: AAROM;Left;20 reps;Supine Hip Flexion/Marching: AAROM;10 reps;Seated;Left (2 sets) Other Exercises Other Exercises: Min A for stand balance for personal hygiene by nursing post toileting    General Comments        Pertinent Vitals/Pain Pain Assessment: Faces Faces Pain Scale: Hurts whole lot Pain Location: LLE  Pain Descriptors / Indicators: Aching;Sore Pain Intervention(s): Monitored during session;Repositioned    Home Living                      Prior Function            PT Goals (current goals can now be found in the care plan section) Progress  towards PT goals: Progressing toward goals    Frequency  BID    PT Plan Current plan remains appropriate    Co-evaluation             End of Session Equipment Utilized During Treatment: Gait belt Activity Tolerance: Patient limited by fatigue;Patient limited by pain Patient left: in bed;with nursing/sitter in room (nursing removing leg pumps and changing dressing)     Time: [id number] PT Time Calculation (min) (ACUTE ONLY): 27 min  Charges:  $Gait Training: 8-22 mins $Therapeutic Exercise: 8-22 mins $Therapeutic Activity: 8-22 mins                    G Codes:      [name] 02/04/2015, 1:55 PM

## 2015-02-05 NOTE — Clinical Social Work Note (Signed)
Clinical Social Work Assessment  Patient Details  Name: Bridget Nolan MRN: [id number] Date of Birth: [birth year]  Date of referral:  02/05/15               Reason for consult:  Facility Placement                Permission sought to share information with:  Family Supports Permission granted to share information::  Yes, Verbal Permission Granted  Name::     friends   Housing/Transportation Living arrangements for the past 2 months:  [place] of Information:  Patient Patient Interpreter Needed:  None Criminal Activity/Legal Involvement Pertinent to Current Situation/Hospitalization:  Yes Significant Relationships:  Friend, Adult Children Lives with:  Self Do you feel safe going back to the place where you live?  Yes Need for family participation in patient care:  No (Coment)  Care giving concerns:  No care giving concerns identified.    Social Worker assessment / plan:  CSW met with pt to address consult. CSW introduced herself and explained role of social work. CSW also explained the process of discharge planning to SNF to [facility]. PT is recommending SNF for STR. Pt is agreeable to discharge plan. CSW contacted [facility] and a bed is available. CSW intiatited referral. Pt declined to have CSW contact family as she will do so.   RN will call report and EMS will provide transportation. CSW is signing off as no further needs identified   Employment status:  Retired Forensic scientist:  Medicare PT Recommendations:  [place] / Referral to community resources:  Other (Comment Required) [facility])  Patient/Family's Response to care:  Pt was appreciative of CSW support.   Patient/Family's Understanding of and Emotional Response to Diagnosis, Current Treatment, and Prognosis:  Pt understands that she needs SNF for STR.  Emotional Assessment Appearance:  Appears stated age Attitude/Demeanor/Rapport:  Other (Appropriate) Affect  (typically observed):  Accepting, Adaptable Orientation:  Oriented to Self, Oriented to Place, Oriented to  Time, Oriented to Situation Alcohol / Substance use:  Never Used Psych involvement (Current and /or in the community):  No (Comment)  Discharge Needs  Concerns to be addressed:    Readmission within the last 30 days:  Yes Current discharge risk:  None Barriers to Discharge:  No Barriers Identified   Darden Dates, LCSW 02/05/2015, 5:07 PM

## 2015-03-05 DIAGNOSIS — S72002A Fracture of unspecified part of neck of left femur, initial encounter for closed fracture: Secondary | ICD-10-CM | POA: Insufficient documentation

## 2015-05-27 ENCOUNTER — Other Ambulatory Visit: Payer: Self-pay | Admitting: Family Medicine

## 2015-05-27 DIAGNOSIS — Z1231 Encounter for screening mammogram for malignant neoplasm of breast: Secondary | ICD-10-CM

## 2015-07-08 ENCOUNTER — Ambulatory Visit
Admission: RE | Admit: 2015-07-08 | Discharge: 2015-07-08 | Disposition: A | Discharge status: Home / Self Care | Payer: Medicare Other | Source: Ambulatory Visit | Attending: Family Medicine | Admitting: Family Medicine

## 2015-07-08 ENCOUNTER — Other Ambulatory Visit: Payer: Self-pay | Admitting: Family Medicine

## 2015-07-08 DIAGNOSIS — Z1231 Encounter for screening mammogram for malignant neoplasm of breast: Secondary | ICD-10-CM | POA: Insufficient documentation

## 2016-08-11 ENCOUNTER — Other Ambulatory Visit: Payer: Self-pay | Admitting: Family Medicine

## 2016-08-11 DIAGNOSIS — Z1231 Encounter for screening mammogram for malignant neoplasm of breast: Secondary | ICD-10-CM

## 2016-08-21 ENCOUNTER — Ambulatory Visit
Admission: RE | Admit: 2016-08-21 | Discharge: 2016-08-21 | Disposition: A | Discharge status: Home / Self Care | Payer: Medicare Other | Source: Ambulatory Visit | Attending: Family Medicine | Admitting: Family Medicine

## 2016-08-21 DIAGNOSIS — Z1231 Encounter for screening mammogram for malignant neoplasm of breast: Secondary | ICD-10-CM | POA: Diagnosis present

## 2017-07-16 ENCOUNTER — Other Ambulatory Visit: Payer: Self-pay | Admitting: Family Medicine

## 2017-07-16 DIAGNOSIS — Z1231 Encounter for screening mammogram for malignant neoplasm of breast: Secondary | ICD-10-CM

## 2017-08-23 ENCOUNTER — Ambulatory Visit
Admission: RE | Admit: 2017-08-23 | Discharge: 2017-08-23 | Disposition: A | Discharge status: Home / Self Care | Payer: Medicare Other | Source: Ambulatory Visit | Attending: Family Medicine | Admitting: Family Medicine

## 2017-08-23 DIAGNOSIS — Z1231 Encounter for screening mammogram for malignant neoplasm of breast: Secondary | ICD-10-CM | POA: Diagnosis not present

## 2019-02-21 ENCOUNTER — Ambulatory Visit: Payer: Medicare Other | Admitting: Nurse Practitioner

## 2019-02-21 ENCOUNTER — Other Ambulatory Visit: Payer: Self-pay

## 2019-02-21 VITALS — BP 130/90 | HR 50 | Temp 97.7°F | Ht 63.0 in | Wt 124.0 lb

## 2019-02-21 DIAGNOSIS — R238 Other skin changes: Secondary | ICD-10-CM | POA: Diagnosis not present

## 2019-02-21 DIAGNOSIS — R233 Spontaneous ecchymoses: Secondary | ICD-10-CM

## 2019-02-21 NOTE — Progress Notes (Signed)
Careteam: Patient Care Team: [place] Pink, MD as PCP - General (Family Medicine)  Advanced Directive information Does Patient Have a Medical Advance Directive?: No, Would patient like information on creating a medical advance directive?: No - Patient declined  Allergies  Allergen Reactions  . Simvastatin     Other reaction(s): Muscle Pain  . Levofloxacin Rash  . Penicillins Rash    Has patient had a PCN reaction causing immediate rash, facial/tongue/throat swelling, SOB or lightheadedness with hypotension: Yes Has patient had a PCN reaction causing severe rash involving mucus membranes or skin necrosis: No Has patient had a PCN reaction that required hospitalization Yes Has patient had a PCN reaction occurring within the last 10 years: No If all of the above answers are "NO", then may proceed with Cephalosporin use.    Chief Complaint  Patient presents with  . Acute Visit    Bruising on Left Lower Leg x 2-3 Days     HPI: Patient is a 86 y.o. female seen in today at the [facility] for bruising. Reports she does not remember falling or hitting anything but reports she bruises easily. No pain, swelling or heat noted.   Reports he has not been to her PCP in years. States "She doctors herself and lets him know"   Review of Systems:  Review of Systems  Constitutional: Negative for chills, fever and weight loss.  HENT: Positive for hearing loss.   Skin: Negative for itching and rash.       Bruises easily     Past Medical History:  Diagnosis Date  . Cancer ([place]) skin  . Hearing loss    Past Surgical History:  Procedure Laterality Date  . BREAST BIOPSY Left neg  . BREAST CYST ASPIRATION Bilateral   . EYE SURGERY Bilateral   . HERNIA REPAIR    . INTRAMEDULLARY (IM) NAIL INTERTROCHANTERIC Left 02/02/2015   Procedure: INTRAMEDULLARY (IM) NAIL INTERTROCHANTRIC;  Surgeon: Claud Kelp, MD;  Location: [facility] ORS;  Service: Orthopedics;  Laterality: Left;   Social  History:   reports that she has never smoked. She has never used smokeless tobacco. She reports current alcohol use of about 8.0 standard drinks of alcohol per week. No history on file for drug.  Family History  Problem Relation Age of Onset  . Thyroid cancer Mother   . Diabetes Neg Hx     Medications: Patient's Medications  New Prescriptions   No medications on file  Previous Medications   No medications on file  Modified Medications   No medications on file  Discontinued Medications   ACETAMINOPHEN (TYLENOL) 325 MG TABLET    Take 2 tablets (650 mg total) by mouth every 6 (six) hours as needed for mild pain (or Fever >/= 101).   BIOTIN PO    Take by mouth daily.   CHOLECALCIFEROL (VITAMIN D) 400 UNITS TABS TABLET    Take 400 Units by mouth daily.   DOCUSATE SODIUM (COLACE) 100 MG CAPSULE    Take 1 capsule (100 mg total) by mouth 2 (two) times daily.   ENOXAPARIN (LOVENOX) 40 MG/0.4ML INJECTION    Inject 0.4 mLs (40 mg total) into the skin daily.   MULTIPLE VITAMIN (MULTIVITAMIN WITH MINERALS) TABS TABLET    Take 1 tablet by mouth daily.    Physical Exam:  Vitals:   02/21/19 1430  BP: 130/90  Pulse: (!) 50  Temp: 97.7 F (36.5 C)  SpO2: 96%  Weight: 124 lb (56.2 kg)  Height:  5\' 3"  (1.6 m)   Body mass index is 21.97 kg/m. Wt Readings from Last 3 Encounters:  02/21/19 124 lb (56.2 kg)  02/02/15 131 lb (59.4 kg)  02/15/13 125 lb (56.7 kg)    Physical Exam Constitutional:      Appearance: Normal appearance.  HENT:     Head: Normocephalic and atraumatic.  Musculoskeletal:     Right lower leg: No edema.     Left lower leg: No edema.  Skin:    Findings: Bruising (large bruise noted to left shin, no heat, swelling, or tenderness noted on exam) present.  Neurological:     Mental Status: She is alert.     Labs reviewed: Basic Metabolic Panel: No results for input(s): NA, K, CL, CO2, GLUCOSE, BUN, CREATININE, CALCIUM, MG, PHOS, TSH in the last 8760 hours. Liver  Function Tests: No results for input(s): AST, ALT, ALKPHOS, BILITOT, PROT, ALBUMIN in the last 8760 hours. No results for input(s): LIPASE, AMYLASE in the last 8760 hours. No results for input(s): AMMONIA in the last 8760 hours. CBC: No results for input(s): WBC, NEUTROABS, HGB, HCT, MCV, PLT in the last 8760 hours. Lipid Panel: No results for input(s): CHOL, HDL, LDLCALC, TRIG, CHOLHDL, LDLDIRECT in the last 8760 hours. TSH: No results for input(s): TSH in the last 8760 hours. A1C: Lab Results  Component Value Date   HGBA1C 5.5 02/03/2015     Assessment/Plan 1. Bruises easily -large bruise noted to left shin, skin intact without signs of infection or major trama, will monitor for now. To notify for heat, swelling, pain or if area opens. No other bruising noted on body. Has not had recent blood work, will follow up cbc next lab day.   Pt would like to establish care to follow up at [facility].  Paperwork given and follow up in 4 weeks.  Carlos American. [place], [facility] [phone]

## 2019-03-28 ENCOUNTER — Ambulatory Visit: Payer: Self-pay | Admitting: Nurse Practitioner

## 2019-04-11 ENCOUNTER — Telehealth: Payer: Self-pay

## 2019-04-11 NOTE — Telephone Encounter (Signed)
Patient agreed to come get Blood work done at [facility] on 04/13/2019 at [place].

## 2019-04-12 NOTE — Telephone Encounter (Signed)
Patient is scheduled for 04/20/2019 at 9am.

## 2019-04-12 NOTE — Telephone Encounter (Signed)
I will call patient and see if I can have her schedule visit after blood work. Patient is hard of hearing.

## 2019-04-12 NOTE — Telephone Encounter (Signed)
Is she planning on establishing care? She cancelled appt- will need appt to formally establish care.

## 2019-04-13 LAB — CBC: RBC: 3.99 (ref 3.87–5.11)

## 2019-04-13 LAB — CBC AND DIFFERENTIAL
HCT: 40 (ref 36–46)
Hemoglobin: 13.4 (ref 12.0–16.0)
Platelets: 226 (ref 150–399)
WBC: 5.2
WBC: 5.2

## 2019-04-14 ENCOUNTER — Telehealth: Payer: Self-pay

## 2019-04-14 NOTE — Telephone Encounter (Signed)
Called patient to discuss labs results. Patient is aware that Blood counts including platelets are in normal range, per Sherrie Mustache, NP.

## 2019-04-20 ENCOUNTER — Ambulatory Visit: Payer: Medicare Other | Admitting: Nurse Practitioner

## 2019-04-20 ENCOUNTER — Encounter: Payer: Self-pay | Admitting: Nurse Practitioner

## 2019-04-20 ENCOUNTER — Other Ambulatory Visit: Payer: Self-pay

## 2019-04-20 VITALS — BP 124/80 | HR 80 | Temp 97.4°F | Resp 16 | Ht 63.0 in | Wt 130.0 lb

## 2019-04-20 DIAGNOSIS — R4189 Other symptoms and signs involving cognitive functions and awareness: Secondary | ICD-10-CM

## 2019-04-20 DIAGNOSIS — H9193 Unspecified hearing loss, bilateral: Secondary | ICD-10-CM

## 2019-04-20 DIAGNOSIS — E2839 Other primary ovarian failure: Secondary | ICD-10-CM

## 2019-04-20 DIAGNOSIS — M810 Age-related osteoporosis without current pathological fracture: Secondary | ICD-10-CM | POA: Diagnosis not present

## 2019-04-20 DIAGNOSIS — Z7189 Other specified counseling: Secondary | ICD-10-CM

## 2019-04-20 NOTE — Progress Notes (Signed)
Careteam: Patient Care Team: Lauree Chandler, NP as PCP - General (Geriatric Medicine)  Advanced Directive information Does Patient Have a Medical Advance Directive?: Yes, Type of Advance Directive: [place];Living will, Does patient want to make changes to medical advance directive?: No - Patient declined  Allergies  Allergen Reactions  . Simvastatin     Other reaction(s): Muscle Pain  . Levofloxacin Rash  . Penicillins Rash    Has patient had a PCN reaction causing immediate rash, facial/tongue/throat swelling, SOB or lightheadedness with hypotension: Yes Has patient had a PCN reaction causing severe rash involving mucus membranes or skin necrosis: No Has patient had a PCN reaction that required hospitalization Yes Has patient had a PCN reaction occurring within the last 10 years: No If all of the above answers are "NO", then may proceed with Cephalosporin use.    Chief Complaint  Patient presents with  . Establish Care    New Patient      HPI: Patient is a 86 y.o. female seen in today at the [facility] to establish care.   Per epic pt with hx of hyperlipidemia, cardiac murmur, fibrocystic breast, anemia, cataracts, skin cancer of the forehead,  Osteoporosis.   . Benign breast lumps, removed in 1994, 1995, and 2002  . CATARACT EXTRACTION  . DILATION AND CURETTAGE, DIAGNOSTIC / THERAPEUTIC  . Hernia operation, 2003, right inguinal  . HERNIA REPAIR  . Open reduction and internal fixation of a left intertrochanteric femur fracture 02/02/2015  Dr Stacy Test  . TONSILLECTOMY     Has not been seen in 2 years. Does well. Reports she has some trouble remembering Reports she walked over the clinic this morning Walks routinely - does not drive any more.  Has not driven in about a year.   Children live around [place]- has not seen her recently due to restrictions.   Memory loss- saw neurologist in 2019, reports symptoms of cognitive impairment  since 2016, recommended supplements but she does not take any at this time.  Reports she is not driving or going to the store so she gave up on vitamins.  Reports she has help that brings her to the store.  Reports grandson does her finances   Review of Systems:  Review of Systems  Constitutional: Negative for chills, fever and weight loss.  HENT: Positive for hearing loss. Negative for tinnitus.   Respiratory: Negative for cough, sputum production and shortness of breath.   Cardiovascular: Negative for chest pain, palpitations and leg swelling.  Gastrointestinal: Negative for abdominal pain, constipation, diarrhea and heartburn.  Genitourinary: Negative for dysuria, frequency and urgency.  Musculoskeletal: Negative for back pain, falls, joint pain and myalgias.  Skin: Negative.   Neurological: Negative for dizziness and headaches.  Psychiatric/Behavioral: Positive for memory loss. Negative for depression. The patient is not nervous/anxious and does not have insomnia.     Past Medical History:  Diagnosis Date  . Cancer ([place]) skin  . Cataract   . Hearing loss    Past Surgical History:  Procedure Laterality Date  . BREAST BIOPSY Left neg  . BREAST CYST ASPIRATION Bilateral   . EYE SURGERY Bilateral   . HERNIA REPAIR    . INTRAMEDULLARY (IM) NAIL INTERTROCHANTERIC Left 02/02/2015   Procedure: INTRAMEDULLARY (IM) NAIL INTERTROCHANTRIC;  Surgeon: Claud Kelp, MD;  Location: [facility] ORS;  Service: Orthopedics;  Laterality: Left;   Social History:   reports that she quit smoking about 76 years ago. She has  never used smokeless tobacco. She reports current alcohol use. She reports that she does not use drugs.  Family History  Problem Relation Age of Onset  . Thyroid cancer Mother   . Kidney disease Brother   . Diabetes Neg Hx     Medications: Patient's Medications   No medications on file    Physical Exam:  Vitals:   04/20/19 0853  BP: 124/80  Pulse: 80  Resp: 16  Temp:  (!) 97.4 F (36.3 C)  SpO2: 96%  Weight: 130 lb (59 kg)  Height: 5\' 3"  (1.6 m)   Body mass index is 23.03 kg/m. Wt Readings from Last 3 Encounters:  04/20/19 130 lb (59 kg)  02/21/19 124 lb (56.2 kg)  02/02/15 131 lb (59.4 kg)    Physical Exam Constitutional:      General: She is not in acute distress.    Appearance: She is well-developed. She is not diaphoretic.  HENT:     Head: Normocephalic and atraumatic.     Mouth/Throat:     Pharynx: No oropharyngeal exudate.  Eyes:     Conjunctiva/sclera: Conjunctivae normal.     Pupils: Pupils are equal, round, and reactive to light.  Cardiovascular:     Rate and Rhythm: Normal rate and regular rhythm.     Heart sounds: Normal heart sounds.  Pulmonary:     Effort: Pulmonary effort is normal.     Breath sounds: Normal breath sounds.  Abdominal:     General: Bowel sounds are normal.     Palpations: Abdomen is soft.  Musculoskeletal:        General: No tenderness.     Cervical back: Normal range of motion and neck supple.  Skin:    General: Skin is warm and dry.  Neurological:     Mental Status: She is alert and oriented to person, place, and time.     Labs reviewed: Basic Metabolic Panel: No results for input(s): NA, K, CL, CO2, GLUCOSE, BUN, CREATININE, CALCIUM, MG, PHOS, TSH in the last 8760 hours. Liver Function Tests: No results for input(s): AST, ALT, ALKPHOS, BILITOT, PROT, ALBUMIN in the last 8760 hours. No results for input(s): LIPASE, AMYLASE in the last 8760 hours. No results for input(s): AMMONIA in the last 8760 hours. CBC: Recent Labs    04/13/19 0000  WBC 5.2  HGB 13.4  HCT 40  PLT 226   Lipid Panel: No results for input(s): CHOL, HDL, LDLCALC, TRIG, CHOLHDL, LDLDIRECT in the last 8760 hours. TSH: No results for input(s): TSH in the last 8760 hours. A1C: Lab Results  Component Value Date   HGBA1C 5.5 02/03/2015     Assessment/Plan 1. Cognitive impairment -she does have significant hearing  loss which could make this appear worse during OV. Will check MMSE at next visit. She has grandson doing finances at this time.    2. Bilateral hearing loss, unspecified hearing loss type Reports she sees audiologist at [facility] but has not followed up recently. Recommend her to follow up with [facility]  [phone] and schedule appt for when they come out to [facility].   3. Osteoporosis without current pathological fracture, unspecified osteoporosis type -noted on hx, recommended cal and vit d, she is walking routinely - DG Bone Density; Future  4. Estrogen deficiency - DG Bone Density; Future  5. Advance care planning -prior DNR in epic, will discuss more and next OV, also will see about completion of MOST form  Next appt: 1 month Janett Billow  Beaulah Corin, [facility] [phone]

## 2019-04-20 NOTE — Patient Instructions (Signed)
To come back Monday 3/22 to clinic for blood work Recommended to take caltrate with D 600/400 twice a day.

## 2019-04-24 LAB — COMPREHENSIVE METABOLIC PANEL
Albumin: 4.3 (ref 3.5–5.0)
Calcium: 10 (ref 8.7–10.7)
GFR calc Af Amer: 44
GFR calc non Af Amer: 38
Globulin: 2.2

## 2019-04-24 LAB — BASIC METABOLIC PANEL
BUN: 23 — AB (ref 4–21)
CO2: 25 — AB (ref 13–22)
Chloride: 103 (ref 99–108)
Creatinine: 1.2 — AB (ref 0.5–1.1)
Glucose: 163
Potassium: 4.6 (ref 3.4–5.3)
Sodium: 140 (ref 137–147)

## 2019-04-24 LAB — HEPATIC FUNCTION PANEL
ALT: 15 (ref 7–35)
AST: 20 (ref 13–35)
Alkaline Phosphatase: 83 (ref 25–125)
Bilirubin, Total: 0.8

## 2019-04-27 ENCOUNTER — Telehealth: Payer: Self-pay

## 2019-04-27 NOTE — Telephone Encounter (Signed)
Patient was called and lab results discussed. Patient is aware her Kidney Function is worse from 3 years ago, advised to hydrate more, and avoid NSAID's. Per Sherrie Mustache, NP. Patient also aware of her upcoming appointment April 15th at [place].

## 2019-04-27 NOTE — Telephone Encounter (Signed)
Also noted that her glucose was elevated, she states she eats a lot of sweet and sugary food, educated to limit this at this time, pt expressed understanding

## 2019-05-11 ENCOUNTER — Ambulatory Visit: Payer: Medicare Other | Admitting: Nurse Practitioner

## 2019-05-11 ENCOUNTER — Encounter: Payer: Self-pay | Admitting: Nurse Practitioner

## 2019-05-11 ENCOUNTER — Other Ambulatory Visit: Payer: Self-pay

## 2019-05-11 VITALS — BP 118/88 | HR 88 | Temp 97.7°F | Resp 16 | Ht 63.0 in | Wt 125.0 lb

## 2019-05-11 DIAGNOSIS — R739 Hyperglycemia, unspecified: Secondary | ICD-10-CM | POA: Diagnosis not present

## 2019-05-11 DIAGNOSIS — W19XXXA Unspecified fall, initial encounter: Secondary | ICD-10-CM | POA: Diagnosis not present

## 2019-05-11 DIAGNOSIS — S61412A Laceration without foreign body of left hand, initial encounter: Secondary | ICD-10-CM

## 2019-05-11 DIAGNOSIS — N1832 Chronic kidney disease, stage 3b: Secondary | ICD-10-CM | POA: Diagnosis not present

## 2019-05-11 DIAGNOSIS — H9193 Unspecified hearing loss, bilateral: Secondary | ICD-10-CM

## 2019-05-11 NOTE — Progress Notes (Signed)
Careteam: Patient Care Team: Lauree Chandler, NP as PCP - General (Geriatric Medicine)  Advanced Directive information    Allergies  Allergen Reactions  . Simvastatin     Other reaction(s): Muscle Pain  . Levofloxacin Rash  . Penicillins Rash    Has patient had a PCN reaction causing immediate rash, facial/tongue/throat swelling, SOB or lightheadedness with hypotension: Yes Has patient had a PCN reaction causing severe rash involving mucus membranes or skin necrosis: No Has patient had a PCN reaction that required hospitalization Yes Has patient had a PCN reaction occurring within the last 10 years: No If all of the above answers are "NO", then may proceed with Cephalosporin use.    No chief complaint on file.    HPI: Patient is a 86 y.o. female seen in today at the [facility] for evaluation after fall. Pt with hx of cognitive impairment, OP and trochanter fracture  She states she was walking home and unsure what happened (poor historian), states she must have stumbled over the curb.  Reports she does not feel like she blacked out. Remembers the fall but unsure how it happened. Reports a lady stopped and picked her up. Reports she is not in any pain and walking around like usual.  Her neighbor helped her bandage up her left thumb which was bleeding  Review of Systems:  Review of Systems  Constitutional: Negative for chills, fever and weight loss.  HENT: Positive for hearing loss. Negative for tinnitus.   Respiratory: Negative for cough, sputum production and shortness of breath.   Cardiovascular: Negative for chest pain, palpitations and leg swelling.  Gastrointestinal: Negative for abdominal pain, constipation and diarrhea.  Genitourinary: Negative for dysuria.  Musculoskeletal: Positive for falls. Negative for back pain, joint pain and myalgias.  Neurological: Negative for dizziness, tingling, sensory change, focal weakness and headaches.    Psychiatric/Behavioral: Positive for memory loss.    Past Medical History:  Diagnosis Date  . Cancer ([place]) skin  . Cataract   . Hearing loss    Past Surgical History:  Procedure Laterality Date  . BREAST BIOPSY Left neg  . BREAST CYST ASPIRATION Bilateral   . EYE SURGERY Bilateral   . HERNIA REPAIR    . INTRAMEDULLARY (IM) NAIL INTERTROCHANTERIC Left 02/02/2015   Procedure: INTRAMEDULLARY (IM) NAIL INTERTROCHANTRIC;  Surgeon: Claud Kelp, MD;  Location: [facility] ORS;  Service: Orthopedics;  Laterality: Left;   Social History:   reports that she quit smoking about 76 years ago. She has never used smokeless tobacco. She reports current alcohol use. She reports that she does not use drugs.  Family History  Problem Relation Age of Onset  . Thyroid cancer Mother   . Kidney disease Brother   . Diabetes Neg Hx     Medications: Patient's Medications   No medications on file    Physical Exam:  There were no vitals filed for this visit. There is no height or weight on file to calculate BMI. Wt Readings from Last 3 Encounters:  04/20/19 130 lb (59 kg)  02/21/19 124 lb (56.2 kg)  02/02/15 131 lb (59.4 kg)    Physical Exam Constitutional:      General: She is not in acute distress.    Appearance: She is well-developed. She is not diaphoretic.  HENT:     Head: Normocephalic and atraumatic.     Mouth/Throat:     Pharynx: No oropharyngeal exudate.  Eyes:     Conjunctiva/sclera: Conjunctivae normal.  Pupils: Pupils are equal, round, and reactive to light.  Cardiovascular:     Rate and Rhythm: Normal rate and regular rhythm.     Heart sounds: Normal heart sounds.  Pulmonary:     Effort: Pulmonary effort is normal.     Breath sounds: Normal breath sounds.  Abdominal:     General: Bowel sounds are normal.     Palpations: Abdomen is soft.  Musculoskeletal:        General: No tenderness.     Right elbow: No swelling, deformity, effusion or lacerations. Normal range of  motion. No tenderness.     Left elbow: Normal.     Right forearm: Normal.     Left forearm: Normal.     Right wrist: Normal.     Left wrist: Normal.     Right hand: Normal.     Left hand: Normal.     Cervical back: Normal range of motion and neck supple.     Right lower leg: No swelling, deformity, lacerations, tenderness or bony tenderness. No edema.     Left lower leg: No swelling, deformity, lacerations, tenderness or bony tenderness. No edema.     Comments: Small abrasion noted to right elbow, bruising noted but without swelling. Pt with full ROM no tenderness.   Left thumb with 1.5 cm laceration to top. Tenderness noted around laceration. No swelling noted at this time.   Right knee with bruising noted but no swelling or tenderness on exam. Normal gait. Without Lower extremity pain and normal ROM   Skin:    General: Skin is warm and dry.  Neurological:     Mental Status: She is alert and oriented to person, place, and time.    Labs reviewed: Basic Metabolic Panel: Recent Labs    04/24/19 0000  NA 140  K 4.6  CL 103  CO2 25*  BUN 23*  CREATININE 1.2*  CALCIUM 10.0   Liver Function Tests: Recent Labs    04/24/19 0000  AST 20  ALT 15  ALKPHOS 83  ALBUMIN 4.3   No results for input(s): LIPASE, AMYLASE in the last 8760 hours. No results for input(s): AMMONIA in the last 8760 hours. CBC: Recent Labs    04/13/19 0000  WBC 5.2  5.2  HGB 13.4  HCT 40  PLT 226   Lipid Panel: No results for input(s): CHOL, HDL, LDLCALC, TRIG, CHOLHDL, LDLDIRECT in the last 8760 hours. TSH: No results for input(s): TSH in the last 8760 hours. A1C: Lab Results  Component Value Date   HGBA1C 5.5 02/03/2015     Assessment/Plan 1. Fall, initial encounter - pt reports she is doing fine after the fall. Denies hitting her head or decrease in LOC, states she think she missed the curb when walking over to get her lunch. -educated to avoid NSAIDs due to CKD but okay to use tylenol  325 mg1-2 tablets every 6 hours if needed for pain.   2. Skin tear of left hand without complication, initial encounter -area originally cleaned by neighbor, unwrapped cleansed again with NS and dressed with steristrips and dressing. She will follow up with [place] nurse in 3 days to reassess.   3. Hyperglycemia Eats a lot of sweets, encouraged decreasing this as her fasting glucose was impaired on last labs.   4. Stage 3b chronic kidney disease -Encourage proper hydration and to avoid NSAIDS (Aleve, Advil, Motrin, Ibuprofen)   5. Bilateral hearing loss, unspecified hearing loss type Stable, she is very HOH.  Next appt: 05/18/2019 Carlos American. [place], [facility] [phone]

## 2019-05-11 NOTE — Patient Instructions (Addendum)
To use ice to knee and elbow 30 mins three times daily  To use tylenol 325 mg 1-2 tablets every 6 hours as needed for pain.  If thumb bandage gets bloody to take clear bandage off but leave on steri-strips.  To use bandaid over area.   To follow up with [facility], independent living nurse on Monday 05/15/19 at 9 am at the [place] blood sugar was elevated on last blood work- make sure to limit your sweets/desserts   You kidney function was also impaired- make sure to maintain proper hydration and to avoid NSAIDS (Aleve, Advil, Motrin, Ibuprofen)

## 2019-05-18 ENCOUNTER — Ambulatory Visit (INDEPENDENT_AMBULATORY_CARE_PROVIDER_SITE_OTHER): Payer: Medicare Other | Admitting: Nurse Practitioner

## 2019-05-18 ENCOUNTER — Encounter: Payer: Self-pay | Admitting: Nurse Practitioner

## 2019-05-18 ENCOUNTER — Other Ambulatory Visit: Payer: Self-pay

## 2019-05-18 VITALS — BP 110/80 | HR 77 | Temp 97.0°F | Resp 16 | Ht 63.0 in | Wt 125.0 lb

## 2019-05-18 DIAGNOSIS — Z0000 Encounter for general adult medical examination without abnormal findings: Secondary | ICD-10-CM | POA: Diagnosis not present

## 2019-05-18 NOTE — Patient Instructions (Signed)
Bridget Nolan , Thank you for taking time to come for your Medicare Wellness Visit. I appreciate your ongoing commitment to your health goals. Please review the following plan we discussed and let me know if I can assist you in the future.   Screening recommendations/referrals: Colonoscopy aged out Mammogram aged out Bone Density RECOMMENDED Recommended yearly ophthalmology/optometry visit for glaucoma screening and checkup Recommended yearly dental visit for hygiene and checkup  Vaccinations: Influenza vaccine due in September 2021 Pneumococcal vaccine- recommended to get pneumonia vaccine  Tdap vaccine-recommended to update - can get at local pharmacy Shingles vaccine recommended to get - can get at local pharmacy    Advanced directives: recommend to bring copy to place on file  Conditions/risks identified: progressive memory decline  Next appointment: 1 year.    Preventive Care 84 Years and Older, Female Preventive care refers to lifestyle choices and visits with your health care provider that can promote health and wellness. What does preventive care include?  A yearly physical exam. This is also called an annual well check.  Dental exams once or twice a year.  Routine eye exams. Ask your health care provider how often you should have your eyes checked.  Personal lifestyle choices, including:  Daily care of your teeth and gums.  Regular physical activity.  Eating a healthy diet.  Avoiding tobacco and drug use.  Limiting alcohol use.  Practicing safe sex.  Taking low-dose aspirin every day.  Taking vitamin and mineral supplements as recommended by your health care provider. What happens during an annual well check? The services and screenings done by your health care provider during your annual well check will depend on your age, overall health, lifestyle risk factors, and family history of disease. Counseling  Your health care provider may ask you questions  about your:  Alcohol use.  Tobacco use.  Drug use.  Emotional well-being.  Home and relationship well-being.  Sexual activity.  Eating habits.  History of falls.  Memory and ability to understand (cognition).  Work and work Statistician.  Reproductive health. Screening  You may have the following tests or measurements:  Height, weight, and BMI.  Blood pressure.  Lipid and cholesterol levels. These may be checked every 5 years, or more frequently if you are over 70 years old.  Skin check.  Lung cancer screening. You may have this screening every year starting at age 25 if you have a 30-pack-year history of smoking and currently smoke or have quit within the past 15 years.  Fecal occult blood test (FOBT) of the stool. You may have this test every year starting at age 22.  Flexible sigmoidoscopy or colonoscopy. You may have a sigmoidoscopy every 5 years or a colonoscopy every 10 years starting at age 84.  Hepatitis C blood test.  Hepatitis B blood test.  Sexually transmitted disease (STD) testing.  Diabetes screening. This is done by checking your blood sugar (glucose) after you have not eaten for a while (fasting). You may have this done every 1-3 years.  Bone density scan. This is done to screen for osteoporosis. You may have this done starting at age 84.  Mammogram. This may be done every 1-2 years. Talk to your health care provider about how often you should have regular mammograms. Talk with your health care provider about your test results, treatment options, and if necessary, the need for more tests. Vaccines  Your health care provider may recommend certain vaccines, such as:  Influenza vaccine. This is recommended every  year.  Tetanus, diphtheria, and acellular pertussis (Tdap, Td) vaccine. You may need a Td booster every 10 years.  Zoster vaccine. You may need this after age 42.  Pneumococcal 13-valent conjugate (PCV13) vaccine. One dose is  recommended after age 19.  Pneumococcal polysaccharide (PPSV23) vaccine. One dose is recommended after age 72. Talk to your health care provider about which screenings and vaccines you need and how often you need them. This information is not intended to replace advice given to you by your health care provider. Make sure you discuss any questions you have with your health care provider. Document Released: 02/15/2015 Document Revised: 10/09/2015 Document Reviewed: 11/20/2014 Elsevier Interactive Patient Education  2017 [place] Prevention in the Home Falls can cause injuries. They can happen to people of all ages. There are many things you can do to make your home safe and to help prevent falls. What can I do on the outside of my home?  Regularly fix the edges of walkways and driveways and fix any cracks.  Remove anything that might make you trip as you walk through a door, such as a raised step or threshold.  Trim any bushes or trees on the path to your home.  Use bright outdoor lighting.  Clear any walking paths of anything that might make someone trip, such as rocks or tools.  Regularly check to see if handrails are loose or broken. Make sure that both sides of any steps have handrails.  Any raised decks and porches should have guardrails on the edges.  Have any leaves, snow, or ice cleared regularly.  Use sand or salt on walking paths during winter.  Clean up any spills in your garage right away. This includes oil or grease spills. What can I do in the bathroom?  Use night lights.  Install grab bars by the toilet and in the tub and shower. Do not use towel bars as grab bars.  Use non-skid mats or decals in the tub or shower.  If you need to sit down in the shower, use a plastic, non-slip stool.  Keep the floor dry. Clean up any water that spills on the floor as soon as it happens.  Remove soap buildup in the tub or shower regularly.  Attach bath mats  securely with double-sided non-slip rug tape.  Do not have throw rugs and other things on the floor that can make you trip. What can I do in the bedroom?  Use night lights.  Make sure that you have a light by your bed that is easy to reach.  Do not use any sheets or blankets that are too big for your bed. They should not hang down onto the floor.  Have a firm chair that has side arms. You can use this for support while you get dressed.  Do not have throw rugs and other things on the floor that can make you trip. What can I do in the kitchen?  Clean up any spills right away.  Avoid walking on wet floors.  Keep items that you use a lot in easy-to-reach places.  If you need to reach something above you, use a strong step stool that has a grab bar.  Keep electrical cords out of the way.  Do not use floor polish or wax that makes floors slippery. If you must use wax, use non-skid floor wax.  Do not have throw rugs and other things on the floor that can make you trip. What can  I do with my stairs?  Do not leave any items on the stairs.  Make sure that there are handrails on both sides of the stairs and use them. Fix handrails that are broken or loose. Make sure that handrails are as long as the stairways.  Check any carpeting to make sure that it is firmly attached to the stairs. Fix any carpet that is loose or worn.  Avoid having throw rugs at the top or bottom of the stairs. If you do have throw rugs, attach them to the floor with carpet tape.  Make sure that you have a light switch at the top of the stairs and the bottom of the stairs. If you do not have them, ask someone to add them for you. What else can I do to help prevent falls?  Wear shoes that:  Do not have high heels.  Have rubber bottoms.  Are comfortable and fit you well.  Are closed at the toe. Do not wear sandals.  If you use a stepladder:  Make sure that it is fully opened. Do not climb a closed  stepladder.  Make sure that both sides of the stepladder are locked into place.  Ask someone to hold it for you, if possible.  Clearly mark and make sure that you can see:  Any grab bars or handrails.  First and last steps.  Where the edge of each step is.  Use tools that help you move around (mobility aids) if they are needed. These include:  Canes.  Walkers.  Scooters.  Crutches.  Turn on the lights when you go into a dark area. Replace any light bulbs as soon as they burn out.  Set up your furniture so you have a clear path. Avoid moving your furniture around.  If any of your floors are uneven, fix them.  If there are any pets around you, be aware of where they are.  Review your medicines with your doctor. Some medicines can make you feel dizzy. This can increase your chance of falling. Ask your doctor what other things that you can do to help prevent falls. This information is not intended to replace advice given to you by your health care provider. Make sure you discuss any questions you have with your health care provider. Document Released: 11/15/2008 Document Revised: 06/27/2015 Document Reviewed: 02/23/2014 Elsevier Interactive Patient Education  2017 [employer].

## 2019-05-18 NOTE — Progress Notes (Signed)
Subjective:   Bridget Nolan is a 86 y.o. female who presents for Medicare Annual (Subsequent) preventive examination.  Review of Systems:         Objective:     Vitals: BP 110/80   Pulse 77   Temp (!) 97 F (36.1 C)   Resp 16   Ht 5\' 3"  (1.6 m)   Wt 125 lb (56.7 kg)   SpO2 97%   BMI 22.14 kg/m   Body mass index is 22.14 kg/m.  Advanced Directives 05/18/2019 05/11/2019 04/20/2019 02/21/2019 02/01/2015  Does Patient Have a Medical Advance Directive? Yes Yes Yes No Yes  Type of Paramedic of [place];Living will [place];Living will [place];Living will - Out of facility DNR (pink MOST or yellow form)  Does patient want to make changes to medical advance directive? No - Patient declined No - Patient declined No - Patient declined - -  Copy of [place] in Chart? No - copy requested No - copy requested No - copy requested - Yes  Would patient like information on creating a medical advance directive? - - - No - Patient declined -  Pre-existing out of facility DNR order (yellow form or pink MOST form) - - - - Yellow form placed in chart (order not valid for inpatient use);Physician notified to receive inpatient order    Tobacco Social History   Tobacco Use  Smoking Status Former Smoker  . Quit date: 02/1943  . Years since quitting: 76.3  Smokeless Tobacco Never Used  Tobacco Comment   Social Smoker     Counseling given: Not Answered Comment: Social Smoker   Clinical Intake:                       Past Medical History:  Diagnosis Date  . Cancer ([place]) skin  . Cataract   . Hearing loss    Past Surgical History:  Procedure Laterality Date  . BREAST BIOPSY Left neg  . BREAST CYST ASPIRATION Bilateral   . EYE SURGERY Bilateral   . HERNIA REPAIR    . INTRAMEDULLARY (IM) NAIL INTERTROCHANTERIC Left 02/02/2015   Procedure: INTRAMEDULLARY (IM) NAIL INTERTROCHANTRIC;  Surgeon:  Claud Kelp, MD;  Location: [facility] ORS;  Service: Orthopedics;  Laterality: Left;   Family History  Problem Relation Age of Onset  . Thyroid cancer Mother   . Kidney disease Brother   . Diabetes Neg Hx    Social History   Socioeconomic History  . Marital status: Widowed    Spouse name: Not on file  . Number of children: Not on file  . Years of education: Not on file  . Highest education level: Not on file  Occupational History  . Not on file  Tobacco Use  . Smoking status: Former Smoker    Quit date: 02/1943    Years since quitting: 76.3  . Smokeless tobacco: Never Used  . Tobacco comment: Social Smoker  Substance and Sexual Activity  . Alcohol use: Yes    Comment: small cups of wine each day  . Drug use: Never  . Sexual activity: Not on file  Other Topics Concern  . Not on file  Social History Narrative   Tobacco use, amount per day now: None   Past tobacco use, amount per day: Social Smoker   How many years did you use tobacco:    Alcohol use (drinks per week): Small cups each day  Diet: Normal 3 meals per day.   Do you drink/eat things with caffeine: Yes   Marital status:   Widowed                               What year were you married? 1949   Do you live in a house, apartment, assisted living, condo, trailer, etc.? House   Is it one or more stories? 1   How many persons live in your home? 1   Do you have pets in your home?( please list)  No   Highest Level of education completed: Regular 4 year degree   Current or past profession: Regular Job   Do you exercise? Yes                                    Type and how often? Walking 58min   Do you have a living will? Yes   Do you have a DNR form?  No                                If not, do you want to discuss one?   Do you have signed POA/HPOA forms?  Yes                      If so, please bring to you appointment    Do you have any difficulty bathing or dressing yourself? No   Do you have difficulty preparing  food or eating? No   Do you have difficulty managing your medications? No    Do you have any difficulty managing your finances? No, but Grandson "Allyne Gee" handles finances.     Do you have any difficulty affording your medications? No       Social Determinants of Health   Financial Resource Strain:   . Difficulty of Paying Living Expenses:   Food Insecurity:   . Worried About Charity fundraiser in the Last Year:   . Arboriculturist in the Last Year:   Transportation Needs:   . Film/video editor (Medical):   Marland Kitchen Lack of Transportation (Non-Medical):   Physical Activity:   . Days of Exercise per Week:   . Minutes of Exercise per Session:   Stress:   . Feeling of Stress :   Social Connections:   . Frequency of Communication with Friends and Family:   . Frequency of Social Gatherings with Friends and Family:   . Attends Religious Services:   . Active Member of Clubs or Organizations:   . Attends Archivist Meetings:   Marland Kitchen Marital Status:     No outpatient encounter medications on file as of 05/18/2019.   No facility-administered encounter medications on file as of 05/18/2019.    Activities of Daily Living No flowsheet data found.  Patient Care Team: Lauree Chandler, NP as PCP - General (Geriatric Medicine)    Assessment:   This is a routine wellness examination for Jamyiah.  Exercise Activities and Dietary recommendations    Goals   None     Fall Risk Fall Risk  05/18/2019 05/11/2019 04/20/2019 02/21/2019  Falls in the past year? 0 1 0 0  Number falls in past yr: 0 0 - 0  Injury with Fall? 0 1  0 0   Is the patient's home free of loose throw rugs in walkways, pet beds, electrical cords, etc?   yes      Grab bars in the bathroom? yes      Handrails on the stairs?   yes      Adequate lighting?   yes  Timed Get Up and Go performed: na  Depression Screen PHQ 2/9 Scores 05/18/2019  PHQ - 2 Score 0     Cognitive Function MMSE - Mini Mental State  Exam 05/18/2019  Orientation to time 5  Orientation to Place 5  Registration 3  Attention/ Calculation 4  Recall 0  Language- name 2 objects 2  Language- repeat 0  Language- follow 3 step command 3  Language- read & follow direction 1  Write a sentence 1  Copy design 0  Total score 24        Immunization History  Administered Date(s) Administered  . Pneumococcal-Unspecified 09/04/1991    Qualifies for Shingles Vaccine?yes, recommend  Screening Tests Health Maintenance  Topic Date Due  . TETANUS/TDAP  Never done  . DEXA SCAN  Never done  . PNA vac Low Risk Adult (2 of 2 - PCV13) 09/03/1992  . INFLUENZA VACCINE  09/03/2019    Cancer Screenings: Lung: Low Dose CT Chest recommended if Age 57-80 years, 30 pack-year currently smoking OR have quit w/in 15years. Patient does not qualify. Breast:  Up to date on Mammogram? na   Up to date of Bone Density/Dexa? No Colorectal: aged out  Additional Screenings:  Hepatitis C Screening: na     Plan:      I have personally reviewed and noted the following in the patient's chart:   . Medical and social history . Use of alcohol, tobacco or illicit drugs  . Current medications and supplements . Functional ability and status . Nutritional status . Physical activity . Advanced directives . List of other physicians . Hospitalizations, surgeries, and ER visits in previous 12 months . Vitals . Screenings to include cognitive, depression, and falls . Referrals and appointments  In addition, I have reviewed and discussed with patient certain preventive protocols, quality metrics, and best practice recommendations. A written personalized care plan for preventive services as well as general preventive health recommendations were provided to patient.     Lauree Chandler, NP  05/18/2019

## 2019-09-13 ENCOUNTER — Telehealth: Payer: Self-pay

## 2019-09-13 NOTE — Telephone Encounter (Signed)
Medication reconciliation done based on form from [place]

## 2019-09-14 ENCOUNTER — Ambulatory Visit: Payer: Medicare Other | Admitting: Internal Medicine

## 2019-09-14 ENCOUNTER — Encounter: Payer: Self-pay | Admitting: Internal Medicine

## 2019-09-14 ENCOUNTER — Other Ambulatory Visit: Payer: Self-pay

## 2019-09-14 VITALS — BP 135/91 | HR 88 | Temp 97.8°F | Resp 16 | Wt 128.4 lb

## 2019-09-14 DIAGNOSIS — L97909 Non-pressure chronic ulcer of unspecified part of unspecified lower leg with unspecified severity: Secondary | ICD-10-CM | POA: Insufficient documentation

## 2019-09-14 DIAGNOSIS — M810 Age-related osteoporosis without current pathological fracture: Secondary | ICD-10-CM | POA: Diagnosis not present

## 2019-09-14 DIAGNOSIS — L97911 Non-pressure chronic ulcer of unspecified part of right lower leg limited to breakdown of skin: Secondary | ICD-10-CM

## 2019-09-14 DIAGNOSIS — K5900 Constipation, unspecified: Secondary | ICD-10-CM

## 2019-09-14 DIAGNOSIS — L989 Disorder of the skin and subcutaneous tissue, unspecified: Secondary | ICD-10-CM | POA: Insufficient documentation

## 2019-09-14 DIAGNOSIS — F0150 Vascular dementia without behavioral disturbance: Secondary | ICD-10-CM | POA: Insufficient documentation

## 2019-09-14 NOTE — Assessment & Plan Note (Signed)
Traumatic Not infected Will try wet to dry dressings during the week to debride Neosporin otherwise

## 2019-09-14 NOTE — Assessment & Plan Note (Signed)
Some slow bowels Not clear how much of an issue it is Will try senna-s 1 daily

## 2019-09-14 NOTE — Assessment & Plan Note (Signed)
Mild Should do well with the support of AL staff Independent with ADLs still

## 2019-09-14 NOTE — Assessment & Plan Note (Signed)
Suspicious for cancer Will set up with derm

## 2019-09-14 NOTE — Progress Notes (Signed)
Subjective:    Patient ID: Bridget Nolan, female    DOB: 31-Jul-1921, 86 y.o.   MRN: [id number]  HPI Visit in assisted living apartment to establish care Reviewed status with Luellen Pucker RN  Moved to assisted living a few weeks ago She has adjusted well  Known osteoporosis  Is on vitamin D Only fracture was left hip--she doesn't really remember that (12/16)  Having trouble with memory This prompted move to [place]  Recent injury---hit right calf on bed Has skin tear and redness Some pain but not much  Recent labs okay GFR 38  Has lesion in front of left ear Growing quickly  Current Outpatient Medications on File Prior to Visit  Medication Sig Dispense Refill  . Biotin 1000 MCG CHEW Chew by mouth daily.    . cholecalciferol (VITAMIN D3) 25 MCG (1000 UNIT) tablet Take 1,000 Units by mouth daily.     No current facility-administered medications on file prior to visit.    Allergies  Allergen Reactions  . Simvastatin     Other reaction(s): Muscle Pain  . Levofloxacin Rash  . Penicillins Rash    Has patient had a PCN reaction causing immediate rash, facial/tongue/throat swelling, SOB or lightheadedness with hypotension: Yes Has patient had a PCN reaction causing severe rash involving mucus membranes or skin necrosis: No Has patient had a PCN reaction that required hospitalization Yes Has patient had a PCN reaction occurring within the last 10 years: No If all of the above answers are "NO", then may proceed with Cephalosporin use.    Past Medical History:  Diagnosis Date  . Cancer ([place]) skin  . Cataract   . Hearing loss   . Osteoporosis   . Vascular dementia [facility])     Past Surgical History:  Procedure Laterality Date  . BREAST BIOPSY Left neg  . BREAST CYST ASPIRATION Bilateral   . EYE SURGERY Bilateral   . HERNIA REPAIR    . INTRAMEDULLARY (IM) NAIL INTERTROCHANTERIC Left 02/02/2015   Procedure: INTRAMEDULLARY (IM) NAIL INTERTROCHANTRIC;  Surgeon: Claud Kelp, MD;   Location: [facility] ORS;  Service: Orthopedics;  Laterality: Left;    Family History  Problem Relation Age of Onset  . Thyroid cancer Mother   . Kidney disease Brother   . Diabetes Neg Hx     Social History   Socioeconomic History  . Marital status: Widowed    Spouse name: Not on file  . Number of children: 2  . Years of education: Not on file  . Highest education level: Not on file  Occupational History  . Occupation: Housewife  Tobacco Use  . Smoking status: Former Smoker    Quit date: 02/1943    Years since quitting: 76.6  . Smokeless tobacco: Never Used  . Tobacco comment: Social Smoker  Substance and Sexual Activity  . Alcohol use: Yes    Comment: small cups of wine at times  . Drug use: Never  . Sexual activity: Not on file  Other Topics Concern  . 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 Difficulty of Paying Living Expenses:   Food Insecurity:   . Worried About Charity fundraiser in the Last Year:   . Arboriculturist in  the Last Year:   Transportation Needs:   . Film/video editor (Medical):   Marland Kitchen Lack of Transportation (Non-Medical):   Physical Activity:   . Days of Exercise per Week:   . Minutes of Exercise per Session:   Stress:   . Feeling of Stress :   Social Connections:   . Frequency of Communication with Friends and Family:   . Frequency of Social Gatherings with Friends and Family:   . Attends Religious Services:   . Active Member of Clubs or Organizations:   . Attends Archivist Meetings:   Marland Kitchen Marital Status:   Intimate Partner Violence:   . Fear of Current or Ex-Partner:   . Emotionally Abused:   Marland Kitchen Physically Abused:   . Sexually Abused:    Review of Systems  Constitutional: Negative for fatigue and  unexpected weight change.  Eyes: Negative for visual disturbance.  Respiratory: Negative for cough, chest tightness and shortness of breath.   Cardiovascular: Positive for leg swelling. Negative for chest pain and palpitations.  Endocrine: Negative for polydipsia and polyuria.  Genitourinary: Negative for dysuria and hematuria.  Musculoskeletal: Negative for back pain and joint swelling.       Occ joint pain in hands  Allergic/Immunologic: Negative for environmental allergies and immunocompromised state.  Neurological: Negative for dizziness and headaches.  Psychiatric/Behavioral: Negative for dysphoric mood and sleep disturbance. The patient is not nervous/anxious.        Objective:   Physical Exam Constitutional:      Appearance: Normal appearance.  HENT:     Mouth/Throat:     Comments: No oral lesions Cardiovascular:     Rate and Rhythm: Normal rate and regular rhythm.     Pulses: Normal pulses.     Heart sounds: No gallop.      Comments: [place] Gr 3/6 blowing systolic murmur Faint pedal pulses Pulmonary:     Effort: Pulmonary effort is normal.     Breath sounds: Normal breath sounds. No wheezing or rales.  Musculoskeletal:     Cervical back: Neck supple.  Lymphadenopathy:     Cervical: No cervical adenopathy.  Skin:    Comments: 3cm crescent ulcer on anterior right lower calf. Non healing eschar. Not infected  Suspicious scaly raised 41mm lesion on upper left cheek  Neurological:     Mental Status: She is alert.     Comments: Knew month, year but not location Didn't know President Repeated herself quite a bit  Psychiatric:        Mood and Affect: Mood normal.        Behavior: Behavior normal.            Assessment & Plan:

## 2019-09-14 NOTE — Assessment & Plan Note (Signed)
Just on vitamin D

## 2019-09-27 ENCOUNTER — Ambulatory Visit: Payer: Medicare Other | Admitting: Internal Medicine

## 2019-11-21 ENCOUNTER — Ambulatory Visit: Payer: Self-pay | Admitting: Nurse Practitioner

## 2019-12-22 ENCOUNTER — Ambulatory Visit: Payer: Medicare Other | Admitting: Internal Medicine

## 2019-12-22 ENCOUNTER — Other Ambulatory Visit: Payer: Self-pay

## 2019-12-22 DIAGNOSIS — M810 Age-related osteoporosis without current pathological fracture: Secondary | ICD-10-CM | POA: Diagnosis not present

## 2019-12-22 DIAGNOSIS — F0150 Vascular dementia without behavioral disturbance: Secondary | ICD-10-CM | POA: Diagnosis not present

## 2019-12-24 ENCOUNTER — Encounter: Payer: Self-pay | Admitting: Internal Medicine

## 2019-12-24 NOTE — Assessment & Plan Note (Signed)
Appreciate ALF care

## 2019-12-24 NOTE — Progress Notes (Signed)
Subjective:    Patient ID: Bridget Nolan, female    DOB: 08-20-1921, 86 y.o.   MRN: [id number]  HPI  Resident seen in [address] for routine follow up. No new concerns from staff or resident, she has settled in well She sleeps well. Independent with ADL's. Walks without device Appetite good, weight up 9 lbs Bowels moving okay, denies urinary issues Denies pain, reflux or SOB  Osteoporosis: Managed on Calcium and Vit D. She gets weight bearing exercise daily.  Vascular Dementia: Mild cognitive needs, stable functional status. She is not taking any medications for this.  Review of Systems      Past Medical History:  Diagnosis Date  . Cancer ([place]) skin  . Cataract   . Hearing loss   . Osteoporosis   . Vascular dementia [facility])     Current Outpatient Medications  Medication Sig Dispense Refill  . Biotin 1000 MCG CHEW Chew by mouth daily.    . cholecalciferol (VITAMIN D3) 25 MCG (1000 UNIT) tablet Take 1,000 Units by mouth daily.     No current facility-administered medications for this visit.    Allergies  Allergen Reactions  . Simvastatin     Other reaction(s): Muscle Pain  . Levofloxacin Rash  . Penicillins Rash    Has patient had a PCN reaction causing immediate rash, facial/tongue/throat swelling, SOB or lightheadedness with hypotension: Yes Has patient had a PCN reaction causing severe rash involving mucus membranes or skin necrosis: No Has patient had a PCN reaction that required hospitalization Yes Has patient had a PCN reaction occurring within the last 10 years: No If all of the above answers are "NO", then may proceed with Cephalosporin use.    Family History  Problem Relation Age of Onset  . Thyroid cancer Mother   . Kidney disease Brother   . Diabetes Neg Hx     Social History   Socioeconomic History  . Marital status: Widowed    Spouse name: Not on file  . Number of children: 2  . Years of education: Not on file  . Highest education level: Not  on file  Occupational History  . Occupation: Housewife  Tobacco Use  . Smoking status: Former Smoker    Quit date: 02/1943    Years since quitting: 76.9  . Smokeless tobacco: Never Used  . Tobacco comment: Social Smoker  Substance and Sexual Activity  . Alcohol use: Yes    Comment: small cups of wine at times  . Drug use: Never  . Sexual activity: Not on file  Other Topics Concern  . 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 Difficulty of Paying Living Expenses: Not on file  Food Insecurity:   . Worried About Charity fundraiser in the Last Year: Not on file  . Ran Out of Food in the Last Year: Not on file  Transportation Needs:   . Lack of Transportation (Medical): Not on file  . Lack of Transportation (Non-Medical): Not on file  Physical Activity:   . Days of Exercise per Week: Not on file  . Minutes of Exercise per Session: Not on file  Stress:   . Feeling of Stress : Not on file  Social Connections:   .  Frequency of Communication with Friends and Family: Not on file  . Frequency of Social Gatherings with Friends and Family: Not on file  . Attends Religious Services: Not on file  . Active Member of Clubs or Organizations: Not on file  . Attends Archivist Meetings: Not on file  . Marital Status: Not on file  Intimate Partner Violence:   . Fear of Current or Ex-Partner: Not on file  . Emotionally Abused: Not on file  . Physically Abused: Not on file  . Sexually Abused: Not on file     Constitutional: Denies fever, malaise, fatigue, headache or abrupt weight changes.  HEENT: Denies eye pain, eye redness, ear pain, ringing in the ears, wax buildup, runny nose, nasal congestion, bloody nose, or sore throat. Respiratory: Denies  difficulty breathing, shortness of breath, cough or sputum production.   Cardiovascular: Denies chest pain, chest tightness, palpitations or swelling in the hands or feet.  Gastrointestinal: Denies abdominal pain, bloating, constipation, diarrhea or blood in the stool.  GU: Denies urgency, frequency, pain with urination, burning sensation, blood in urine, odor or discharge. Musculoskeletal: Denies decrease in range of motion, difficulty with gait, muscle pain or joint pain and swelling.  Skin: Denies redness, rashes, lesions or ulcercations.  Neurological: Pt reports mild cognitive impairment. Denies dizziness,  difficulty with speech or problems with balance and coordination.  Psych: Denies anxiety, depression, SI/HI.  No other specific complaints in a complete review of systems (except as listed in HPI above).  Objective:   Physical Exam   BP (!) 147/80   Pulse 97   Temp 97.7 F (36.5 C)   Resp 18   Wt 137 lb 3.2 oz (62.2 kg)   SpO2 98%   BMI 24.30 kg/m  Wt Readings from Last 3 Encounters:  12/24/19 137 lb 3.2 oz (62.2 kg)  09/14/19 128 lb 6.4 oz (58.2 kg)  05/18/19 125 lb (56.7 kg)    General: Appears her stated age, well developed, well nourished in NAD. HEENT: HOH Cardiovascular: Normal rate and rhythm. S1,S2 noted.  No murmur, rubs or gallops noted. 1+ edema to LLE, no redness or warmth noted.  Pulmonary/Chest: Normal effort and positive vesicular breath sounds. No respiratory distress. No wheezes, rales or ronchi noted.  Abdomen: Soft and nontender. Normal bowel sounds. No distention or masses noted.  Musculoskeletal: No difficulty with gait.  Neurological: Alert and oriented.  BMET    Component Value Date/Time   NA 140 04/24/2019 0000   K 4.6 04/24/2019 0000   CL 103 04/24/2019 0000   CO2 25 (A) 04/24/2019 0000   GLUCOSE 121 (H) 02/04/2015 0426   BUN 23 (A) 04/24/2019 0000   CREATININE 1.2 (A) 04/24/2019 0000   CREATININE 0.65 02/04/2015 0426   CALCIUM 10.0  04/24/2019 0000   GFRNONAA 38 04/24/2019 0000   GFRAA 44 04/24/2019 0000    Lipid Panel  No results found for: CHOL, TRIG, HDL, CHOLHDL, VLDL, LDLCALC  CBC    Component Value Date/Time   WBC 5.2 04/13/2019 0000   WBC 5.2 04/13/2019 0000   WBC 7.3 02/04/2015 0426   RBC 3.99 04/13/2019 0000   HGB 13.4 04/13/2019 0000   HCT 40 04/13/2019 0000   PLT 226 04/13/2019 0000   MCV 98.2 02/04/2015 0426   MCH 34.0 02/04/2015 0426   MCHC 34.6 02/04/2015 0426   RDW 12.3 02/04/2015 0426   LYMPHSABS 0.5 (L) 02/01/2015 2208   MONOABS 0.5 02/01/2015 2208   EOSABS 0.1 02/01/2015 2208  BASOSABS 0.0 02/01/2015 2208    Hgb A1C Lab Results  Component Value Date   HGBA1C 5.5 02/03/2015           Assessment & Plan:

## 2019-12-24 NOTE — Assessment & Plan Note (Signed)
Continue Vit D Encouraged daily weight bearing exercise 

## 2019-12-24 NOTE — Patient Instructions (Signed)
Osteoporosis  Osteoporosis happens when your bones get thin and weak. This can cause your bones to break (fracture) more easily. You can do things at home to make your bones stronger. Follow these instructions at home:  Activity  Exercise as told by your doctor. Ask your doctor what activities are safe for you. You should do: ? Exercises that make your muscles work to hold your body weight up (weight-bearing exercises). These include tai chi, yoga, and walking. ? Exercises to make your muscles stronger. One example is lifting weights. Lifestyle  Limit alcohol intake to no more than 1 drink a day for nonpregnant women and 2 drinks a day for men. One drink equals 12 oz of beer, 5 oz of wine, or 1 oz of hard liquor.  Do not use any products that have nicotine or tobacco in them. These include cigarettes and e-cigarettes. If you need help quitting, ask your doctor. Preventing falls  Use tools to help you move around (mobility aids) as needed. These include canes, walkers, scooters, and crutches.  Keep rooms well-lit and free of clutter.  Put away things that could make you trip. These include cords and rugs.  Install safety rails on stairs. Install grab bars in bathrooms.  Use rubber mats in slippery areas, like bathrooms.  Wear shoes that: ? Fit you well. ? Support your feet. ? Have closed toes. ? Have rubber soles or low heels.  Tell your doctor about all of the medicines you are taking. Some medicines can make you more likely to fall. General instructions  Eat plenty of calcium and vitamin D. These nutrients are good for your bones. Good sources of calcium and vitamin D include: ? Some fatty fish, such as salmon and tuna. ? Foods that have calcium and vitamin D added to them (fortified foods). For example, some breakfast cereals are fortified with calcium and vitamin D. ? Egg yolks. ? Cheese. ? Liver.  Take over-the-counter and prescription medicines only as told by your  doctor.  Keep all follow-up visits as told by your doctor. This is important. Contact a doctor if:  You have not been tested (screened) for osteoporosis and you are: ? A woman who is age 65 or older. ? A man who is age 70 or older. Get help right away if:  You fall.  You get hurt. Summary  Osteoporosis happens when your bones get thin and weak.  Weak bones can break (fracture) more easily.  Eat plenty of calcium and vitamin D. These nutrients are good for your bones.  Tell your doctor about all of the medicines that you take. This information is not intended to replace advice given to you by your health care provider. Make sure you discuss any questions you have with your health care provider. Document Revised: 01/01/2017 Document Reviewed: 11/13/2016 Elsevier Patient Education  2020 Elsevier Inc.  

## 2020-01-10 ENCOUNTER — Ambulatory Visit: Payer: Medicare Other | Admitting: Internal Medicine

## 2020-01-10 ENCOUNTER — Other Ambulatory Visit: Payer: Self-pay

## 2020-01-10 VITALS — BP 132/74 | HR 94 | Temp 98.1°F | Resp 18 | Wt 138.2 lb

## 2020-01-10 DIAGNOSIS — J209 Acute bronchitis, unspecified: Secondary | ICD-10-CM

## 2020-01-11 ENCOUNTER — Encounter: Payer: Self-pay | Admitting: Internal Medicine

## 2020-01-11 NOTE — Progress Notes (Signed)
Subjective:    Patient ID: Bridget Nolan, female    DOB: 1922-01-06, 86 y.o.   MRN: [id number]  HPI  Asked to see resident in [address] C/o cough x 1 week. Cough is productive of yellow mucous. She denies runny nose, nasal congestion, sore throat, loss of taste/smell or SOB.  No fevers RN has been giving Robitussin DM  Review of Systems      Past Medical History:  Diagnosis Date  . Cancer ([place]) skin  . Cataract   . Hearing loss   . Osteoporosis   . Vascular dementia [facility])     Current Outpatient Medications  Medication Sig Dispense Refill  . Biotin 1000 MCG CHEW Chew by mouth daily.    . cholecalciferol (VITAMIN D3) 25 MCG (1000 UNIT) tablet Take 1,000 Units by mouth daily.     No current facility-administered medications for this visit.    Allergies  Allergen Reactions  . Simvastatin     Other reaction(s): Muscle Pain  . Levofloxacin Rash  . Penicillins Rash    Has patient had a PCN reaction causing immediate rash, facial/tongue/throat swelling, SOB or lightheadedness with hypotension: Yes Has patient had a PCN reaction causing severe rash involving mucus membranes or skin necrosis: No Has patient had a PCN reaction that required hospitalization Yes Has patient had a PCN reaction occurring within the last 10 years: No If all of the above answers are "NO", then may proceed with Cephalosporin use.    Family History  Problem Relation Age of Onset  . Thyroid cancer Mother   . Kidney disease Brother   . Diabetes Neg Hx     Social History   Socioeconomic History  . Marital status: Widowed    Spouse name: Not on file  . Number of children: 2  . Years of education: Not on file  . Highest education level: Not on file  Occupational History  . Occupation: Housewife  Tobacco Use  . Smoking status: Former Smoker    Quit date: 02/1943    Years since quitting: 76.9  . Smokeless tobacco: Never Used  . Tobacco comment: Social Smoker  Substance and Sexual Activity   . Alcohol use: Yes    Comment: small cups of wine at times  . Drug use: Never  . Sexual activity: Not on file  Other Topics Concern  . 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  HEENT: Denies eye pain, eye redness, ear pain, ringing in the ears, wax buildup, runny nose, nasal congestion, bloody nose, or sore throat. Respiratory: Pt reports cough. Denies difficulty breathing, shortness of breath   Cardiovascular: Denies chest pain, chest tightness, palpitations or swelling in the hands or feet.  Gastrointestinal: Denies abdominal pain, bloating, constipation, diarrhea or blood in the stool.    No other specific complaints in a complete review of systems (except as listed in HPI above).  Objective:   Physical Exam  BP 132/74   Pulse 94   Temp 98.1 F (36.7 C)   Resp 18   Wt 138 lb 3.2 oz (62.7 kg)   SpO2 98%   BMI 24.48 kg/m  Wt Readings from Last 3 Encounters:  01/11/20 138 lb 3.2 oz (62.7 kg)  12/24/19 137 lb 3.2 oz (62.2 kg)  09/14/19 128 lb 6.4 oz (58.2 kg)    General: Appears her stated age, in NAD. Neck:  No nodes. Cardiovascular: Normal rate and rhythm.  Pulmonary/Chest: Scattered rhonchi and wheezing throughout. Neurological: Alert and oriented.   BMET    Component Value Date/Time   NA 140 04/24/2019 0000   K 4.6 04/24/2019 0000   CL 103 04/24/2019 0000   CO2 25 (A) 04/24/2019 0000   GLUCOSE 121 (H) 02/04/2015 0426   BUN 23 (A)  04/24/2019 0000   CREATININE 1.2 (A) 04/24/2019 0000   CREATININE 0.65 02/04/2015 0426   CALCIUM 10.0 04/24/2019 0000   GFRNONAA 38 04/24/2019 0000   GFRAA 44 04/24/2019 0000    Lipid Panel  No results found for: CHOL, TRIG, HDL, CHOLHDL, VLDL, LDLCALC  CBC    Component Value Date/Time   WBC 5.2 04/13/2019 0000   WBC 5.2 04/13/2019 0000   WBC 7.3 02/04/2015 0426   RBC 3.99 04/13/2019 0000   HGB 13.4 04/13/2019 0000   HCT 40 04/13/2019 0000   PLT 226 04/13/2019 0000   MCV 98.2 02/04/2015 0426   MCH 34.0 02/04/2015 0426   MCHC 34.6 02/04/2015 0426   RDW 12.3 02/04/2015 0426   LYMPHSABS 0.5 (L) 02/01/2015 2208   MONOABS 0.5 02/01/2015 2208   EOSABS 0.1 02/01/2015 2208   BASOSABS 0.0 02/01/2015 2208    Hgb A1C Lab Results  Component Value Date   HGBA1C 5.5 02/03/2015          Assessment & Plan:   Acute Bronchitis:  Given duration of symptoms, will treat with Azithromycin x 5 days Prednisone 10 mg daily x 5 days Continue Robitussin DM as needed Chest xray if worsens  Will reassess as needed Webb Silversmith, NP

## 2020-01-11 NOTE — Patient Instructions (Signed)

## 2020-01-24 ENCOUNTER — Ambulatory Visit: Payer: Medicare Other | Admitting: Internal Medicine

## 2020-01-24 ENCOUNTER — Other Ambulatory Visit: Payer: Self-pay

## 2020-01-24 VITALS — BP 144/78 | HR 76 | Temp 97.6°F | Resp 18

## 2020-01-24 DIAGNOSIS — R059 Cough, unspecified: Secondary | ICD-10-CM | POA: Diagnosis not present

## 2020-01-24 DIAGNOSIS — R0982 Postnasal drip: Secondary | ICD-10-CM | POA: Diagnosis not present

## 2020-01-25 ENCOUNTER — Encounter: Payer: Self-pay | Admitting: Internal Medicine

## 2020-01-25 NOTE — Progress Notes (Signed)
Subjective:    Patient ID: Bridget Nolan, female    DOB: [birth year], 86 y.o.   MRN: [id number]  HPI  Asked to recheck resident in [address] 3 week history of cough and congestion, seen 12/8 for the same Chest xray negative, treated with Azithromycin, Prednisone and nebs RN reports overall pt better but still has deep cough Resident denies headache, runny nose, nasal congestion, ear pain or sore throat. Reports cough is non productive, with some SOB with exertion, no chest pain Has been getting Robitussin DM No fevers.  Review of Systems      Past Medical History:  Diagnosis Date  . Cancer ([place]) skin  . Cataract   . Hearing loss   . Osteoporosis   . Vascular dementia [facility])     Current Outpatient Medications  Medication Sig Dispense Refill  . Biotin 1000 MCG CHEW Chew by mouth daily.    . cholecalciferol (VITAMIN D3) 25 MCG (1000 UNIT) tablet Take 1,000 Units by mouth daily.     No current facility-administered medications for this visit.    Allergies  Allergen Reactions  . Simvastatin     Other reaction(s): Muscle Pain  . Levofloxacin Rash  . Penicillins Rash    Has patient had a PCN reaction causing immediate rash, facial/tongue/throat swelling, SOB or lightheadedness with hypotension: Yes Has patient had a PCN reaction causing severe rash involving mucus membranes or skin necrosis: No Has patient had a PCN reaction that required hospitalization Yes Has patient had a PCN reaction occurring within the last 10 years: No If all of the above answers are "NO", then may proceed with Cephalosporin use.    Family History  Problem Relation Age of Onset  . Thyroid cancer Mother   . Kidney disease Brother   . Diabetes Neg Hx     Social History   Socioeconomic History  . Marital status: Widowed    Spouse name: Not on file  . Number of children: 2  . Years of education: Not on file  . Highest education level: Not on file  Occupational History  . Occupation:  Housewife  Tobacco Use  . Smoking status: Former Smoker    Quit date: 02/1943    Years since quitting: 77.0  . Smokeless tobacco: Never Used  . Tobacco comment: Social Smoker  Substance and Sexual Activity  . Alcohol use: Yes    Comment: small cups of wine at times  . Drug use: Never  . Sexual activity: Not on file  Other Topics Concern  . 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  HEENT: Denies eye pain, eye redness, ear pain, ringing in the ears, wax buildup, runny nose, nasal congestion, bloody nose, or sore throat. Respiratory: Pt reports cough, sob with exertion. Denies difficulty breathing, or sputum production.   Cardiovascular: Denies chest pain, chest tightness, palpitations or swelling in the hands or feet.   No other specific complaints in a  complete review of systems (except as listed in HPI above).  Objective:   Physical Exam  BP (!) 144/78   Pulse 76   Temp 97.6 F (36.4 C)   Resp 18   SpO2 95%  Wt Readings from Last 3 Encounters:  01/11/20 138 lb 3.2 oz (62.7 kg)  12/24/19 137 lb 3.2 oz (62.2 kg)  09/14/19 128 lb 6.4 oz (58.2 kg)    General: Appears her stated age, well developed, well nourished in NAD. Neck:  No adenopathy noted. Pulmonary/Chest: Normal effort and positive vesicular breath sounds. No respiratory distress. No wheezes, rales or ronchi noted.  Neurological: Alert and oriented.   BMET    Component Value Date/Time   NA 140  04/24/2019 0000   K 4.6 04/24/2019 0000   CL 103 04/24/2019 0000   CO2 25 (A) 04/24/2019 0000   GLUCOSE 121 (H) 02/04/2015 0426   BUN 23 (A) 04/24/2019 0000   CREATININE 1.2 (A) 04/24/2019 0000   CREATININE 0.65 02/04/2015 0426   CALCIUM 10.0 04/24/2019 0000   GFRNONAA 38 04/24/2019 0000   GFRAA 44 04/24/2019 0000    Lipid Panel  No results found for: CHOL, TRIG, HDL, CHOLHDL, VLDL, LDLCALC  CBC    Component Value Date/Time   WBC 5.2 04/13/2019 0000   WBC 5.2 04/13/2019 0000   WBC 7.3 02/04/2015 0426   RBC 3.99 04/13/2019 0000   HGB 13.4 04/13/2019 0000   HCT 40 04/13/2019 0000   PLT 226 04/13/2019 0000   MCV 98.2 02/04/2015 0426   MCH 34.0 02/04/2015 0426   MCHC 34.6 02/04/2015 0426   RDW 12.3 02/04/2015 0426   LYMPHSABS 0.5 (L) 02/01/2015 2208   MONOABS 0.5 02/01/2015 2208   EOSABS 0.1 02/01/2015 2208   BASOSABS 0.0 02/01/2015 2208    Hgb A1C Lab Results  Component Value Date   HGBA1C 5.5 02/03/2015            Assessment & Plan:   Cough secondary to PND:  Clinically improved No indication for repeat chest xray, abx or steroids Duonebs d/c'd Will start Zyrtec 10 mg daily for PND Continue Robitussin DM prn  Will reassess as needed Webb Silversmith, NP

## 2020-01-25 NOTE — Patient Instructions (Signed)

## 2020-01-31 ENCOUNTER — Ambulatory Visit: Payer: Medicare Other | Admitting: Internal Medicine

## 2020-01-31 VITALS — BP 157/87 | HR 82 | Temp 97.8°F | Resp 20 | Wt 138.2 lb

## 2020-01-31 DIAGNOSIS — R5383 Other fatigue: Secondary | ICD-10-CM | POA: Diagnosis not present

## 2020-02-01 ENCOUNTER — Encounter: Payer: Self-pay | Admitting: Internal Medicine

## 2020-02-01 NOTE — Progress Notes (Signed)
Subjective:    Patient ID: Bridget Nolan, female    DOB: 03/29/1921, 86 y.o.   MRN: [id number]  HPI  Asked to reevaluate resident in [address] by RN and Family Was treated for bronchitis 12/8 with Azithromycin and Prednisone, chest xray at that time negative. She had issues with persistent cough, SOB and overall just not feeling well. Was reevaluated 12/22, clinically seemed improved. Started on Zyrtec for cough associated with post nasal drip. RN reports she has not quite returned to baseline since being sick- some issues with SOB over the weekend, RN wanted to send her out for evaluation but pt refused due to Christmas/family visiting.  Resident reports she feels much better, just "lazy". She reports she is sleeping well. She denies runny nose, nasal congestion, ear pain, sore throat, cough or SOB at this time. Her appetite has been good. She denies any body aches or joint pain at this time. She is a little constipated but that is her normal. She denies urinary issues.   Review of Systems      Past Medical History:  Diagnosis Date  . Cancer ([facility]) skin  . Cataract   . Hearing loss   . Osteoporosis   . Vascular dementia [facility](HCC)     Current Outpatient Medications  Medication Sig Dispense Refill  . Biotin 1000 MCG CHEW Chew by mouth daily.    . cetirizine (ZYRTEC) 10 MG tablet Take 1 tablet (10 mg total) by mouth daily. 30 tablet 11  . cholecalciferol (VITAMIN D3) 25 MCG (1000 UNIT) tablet Take 1,000 Units by mouth daily.     No current facility-administered medications for this visit.    Allergies  Allergen Reactions  . Simvastatin     Other reaction(s): Muscle Pain  . Levofloxacin Rash  . Penicillins Rash    Has patient had a PCN reaction causing immediate rash, facial/tongue/throat swelling, SOB or lightheadedness with hypotension: Yes Has patient had a PCN reaction causing severe rash involving mucus membranes or skin necrosis: No Has patient had a PCN reaction that  required hospitalization Yes Has patient had a PCN reaction occurring within the last 10 years: No If all of the above answers are "NO", then may proceed with Cephalosporin use.    Family History  Problem Relation Age of Onset  . Thyroid cancer Mother   . Kidney disease Brother   . Diabetes Neg Hx     Social History   Socioeconomic History  . Marital status: Widowed    Spouse name: Not on file  . Number of children: 2  . Years of education: Not on file  . Highest education level: Not on file  Occupational History  . Occupation: Housewife  Tobacco Use  . Smoking status: Former Smoker    Quit date: 02/1943    Years since quitting: 77.0  . Smokeless tobacco: Never Used  . Tobacco comment: Social Smoker  Substance and Sexual Activity  . Alcohol use: Yes    Comment: small cups of wine at times  . Drug use: Never  . Sexual activity: Not on file  Other Topics Concern  . Not on file  Social History Narrative   Widowed ~2008   2 daughters   Has living will   Grandson Armanda MagicRobert Baldwin is financial manager---?daughter for health care Dewayne Hatch(Ann or Windell MouldingRuth)   Requests DNR---done 09/14/19   Social Determinants of Health   Financial Resource Strain: Not on file  Food Insecurity: Not on file  Transportation Needs: Not on  file  Physical Activity: Not on file  Stress: Not on file  Social Connections: Not on file  Intimate Partner Violence: Not on file     Constitutional: Pt reports fatigue. Denies fever, malaise, headache or abrupt weight changes.  HEENT: Denies eye pain, eye redness, ear pain, ringing in the ears, wax buildup, runny nose, nasal congestion, bloody nose, or sore throat. Respiratory: Denies difficulty breathing, shortness of breath, cough or sputum production.   Cardiovascular: Denies chest pain, chest tightness, palpitations or swelling in the hands or feet.  Gastrointestinal: Pt reports constipation. Denies abdominal pain, bloating, diarrhea or blood in the stool.  GU:  Denies urgency, frequency, pain with urination, burning sensation, blood in urine, odor or discharge.   No other specific complaints in a complete review of systems (except as listed in HPI above).  Objective:   Physical Exam   BP (!) 157/87   Pulse 82   Temp 97.8 F (36.6 C)   Resp 20   Wt 138 lb 3.2 oz (62.7 kg)   BMI 24.48 kg/m  Wt Readings from Last 3 Encounters:  02/01/20 138 lb 3.2 oz (62.7 kg)  01/11/20 138 lb 3.2 oz (62.7 kg)  12/24/19 137 lb 3.2 oz (62.2 kg)    General: Appears her stated age, in NAD. Neck:  No adenopathy noted. Cardiovascular: Normal rate and rhythm. S1,S2 noted.  No murmur, rubs or gallops noted. No JVD or BLE edema.  Pulmonary/Chest: Normal effort and positive vesicular breath sounds. No respiratory distress. No wheezes, rales or ronchi noted.  Abdomen: Soft and nontender. Normal bowel sounds.  Neurological: Alert and oriented. HOH   BMET    Component Value Date/Time   NA 140 04/24/2019 0000   K 4.6 04/24/2019 0000   CL 103 04/24/2019 0000   CO2 25 (A) 04/24/2019 0000   GLUCOSE 121 (H) 02/04/2015 0426   BUN 23 (A) 04/24/2019 0000   CREATININE 1.2 (A) 04/24/2019 0000   CREATININE 0.65 02/04/2015 0426   CALCIUM 10.0 04/24/2019 0000   GFRNONAA 38 04/24/2019 0000   GFRAA 44 04/24/2019 0000    Lipid Panel  No results found for: CHOL, TRIG, HDL, CHOLHDL, VLDL, LDLCALC  CBC    Component Value Date/Time   WBC 5.2 04/13/2019 0000   WBC 5.2 04/13/2019 0000   WBC 7.3 02/04/2015 0426   RBC 3.99 04/13/2019 0000   HGB 13.4 04/13/2019 0000   HCT 40 04/13/2019 0000   PLT 226 04/13/2019 0000   MCV 98.2 02/04/2015 0426   MCH 34.0 02/04/2015 0426   MCHC 34.6 02/04/2015 0426   RDW 12.3 02/04/2015 0426   LYMPHSABS 0.5 (L) 02/01/2015 2208   MONOABS 0.5 02/01/2015 2208   EOSABS 0.1 02/01/2015 2208   BASOSABS 0.0 02/01/2015 2208    Hgb A1C Lab Results  Component Value Date   HGBA1C 5.5 02/03/2015           Assessment & Plan:    Fatigue:  Clinically improved and exam benign Will check CBC, BMET and TSH for further evaluation of fatigue Overall seems to be doing well- will continue to monitor, appreciate ALF support  Will follow up after labs, return precautions discussed Nicki Reaper, NP This visit occurred during the SARS-CoV-2 public health emergency.  Safety protocols were in place, including screening questions prior to the visit, additional usage of staff PPE, and extensive cleaning of exam room while observing appropriate contact time as indicated for disinfecting solutions.

## 2020-04-17 ENCOUNTER — Other Ambulatory Visit: Payer: Self-pay

## 2020-04-17 ENCOUNTER — Ambulatory Visit: Payer: Medicare Other | Admitting: Internal Medicine

## 2020-04-17 ENCOUNTER — Encounter: Payer: Self-pay | Admitting: Internal Medicine

## 2020-04-17 VITALS — BP 160/87 | HR 88 | Temp 97.6°F | Resp 20 | Wt 141.4 lb

## 2020-04-17 DIAGNOSIS — R609 Edema, unspecified: Secondary | ICD-10-CM | POA: Diagnosis not present

## 2020-04-17 NOTE — Progress Notes (Signed)
Subjective:    Patient ID: Bridget Nolan, female    DOB: 02/22/1921, 87 y.o.   MRN: [id number]  HPI  Resident seen in [address] RN reports increased swelling to bilateral lower extremities.  Resident does not know when this started. She denies pain, redness, numbness, tingling or weakness. She has no prior history of heart failure.  She denies chronic cough or shortness of breath. There is no echocardiogram on file.   Review of Systems      Past Medical History:  Diagnosis Date  . Cancer ([place]) skin  . Cataract   . Hearing loss   . Osteoporosis   . Vascular dementia [facility])     Current Outpatient Medications  Medication Sig Dispense Refill  . Biotin 1000 MCG CHEW Chew by mouth daily.    . cetirizine (ZYRTEC) 10 MG tablet Take 1 tablet (10 mg total) by mouth daily. 30 tablet 11  . cholecalciferol (VITAMIN D3) 25 MCG (1000 UNIT) tablet Take 1,000 Units by mouth daily.     No current facility-administered medications for this visit.    Allergies  Allergen Reactions  . Simvastatin     Other reaction(s): Muscle Pain  . Levofloxacin Rash  . Penicillins Rash    Has patient had a PCN reaction causing immediate rash, facial/tongue/throat swelling, SOB or lightheadedness with hypotension: Yes Has patient had a PCN reaction causing severe rash involving mucus membranes or skin necrosis: No Has patient had a PCN reaction that required hospitalization Yes Has patient had a PCN reaction occurring within the last 10 years: No If all of the above answers are "NO", then may proceed with Cephalosporin use.    Family History  Problem Relation Age of Onset  . Thyroid cancer Mother   . Kidney disease Brother   . Diabetes Neg Hx     Social History   Socioeconomic History  . Marital status: Widowed    Spouse name: Not on file  . Number of children: 2  . Years of education: Not on file  . Highest education level: Not on file  Occupational History  . Occupation: Housewife   Tobacco Use  . Smoking status: Former Smoker    Quit date: 02/1943    Years since quitting: 77.2  . Smokeless tobacco: Never Used  . Tobacco comment: Social Smoker  Substance and Sexual Activity  . Alcohol use: Yes    Comment: small cups of wine at times  . Drug use: Never  . Sexual activity: Not on file  Other Topics Concern  . 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  Respiratory: Denies difficulty breathing, shortness of breath, cough or sputum production.   Cardiovascular: Pt reports BLE edema. Denies chest pain, chest tightness, palpitations or swelling in the hands.  Musculoskeletal: Denies decrease in range of motion, difficulty with gait, muscle pain or joint pain and swelling.  Skin: Denies redness, rashes, lesions or ulcercations.  No other specific complaints in a complete review of systems (except as listed in HPI above).  Objective:   Physical Exam  BP (!) 160/87   Pulse 88   Temp 97.6 F (36.4 C)   Resp 20   Wt 141 lb 6.4 oz (64.1 kg)   BMI 25.05 kg/m   Wt Readings from Last 3 Encounters:  02/01/20 138 lb 3.2 oz (62.7 kg)  01/11/20 138 lb 3.2 oz (62.7 kg)  12/24/19 137 lb 3.2 oz (62.2 kg)    General: Appears her stated age, well developed, well nourished in NAD. Skin: Warm, dry and intact. No redness or warmth noted. Neck: No JVD. Cardiovascular: Normal rate and rhythm. S1,S2 noted.  No murmur, rubs or gallops noted. 1+ pitting BLE  edema, L>R.  Pulmonary/Chest: Normal effort and positive vesicular breath sounds. No respiratory distress. No wheezes, rales or ronchi noted.  Musculoskeletal: Gait not visualized. Neurological: Alert and oriented.    BMET    Component Value Date/Time   NA 140 04/24/2019 0000   K 4.6 04/24/2019 0000   CL 103 04/24/2019 0000   CO2 25 (A) 04/24/2019 0000   GLUCOSE 121 (H) 02/04/2015 0426   BUN 23 (A) 04/24/2019 0000   CREATININE 1.2 (A) 04/24/2019 0000   CREATININE 0.65 02/04/2015 0426   CALCIUM 10.0 04/24/2019 0000   GFRNONAA 38 04/24/2019 0000   GFRAA 44 04/24/2019 0000    Lipid Panel  No results found for: CHOL, TRIG, HDL, CHOLHDL, VLDL, LDLCALC  CBC    Component Value Date/Time   WBC 5.2 04/13/2019 0000   WBC 5.2 04/13/2019 0000   WBC 7.3 02/04/2015 0426   RBC 3.99 04/13/2019 0000   HGB 13.4 04/13/2019 0000   HCT 40 04/13/2019 0000   PLT 226 04/13/2019 0000   MCV 98.2 02/04/2015 0426   MCH 34.0 02/04/2015 0426   MCHC 34.6 02/04/2015 0426   RDW 12.3 02/04/2015 0426   LYMPHSABS 0.5 (L) 02/01/2015 2208   MONOABS 0.5 02/01/2015 2208   EOSABS 0.1 02/01/2015 2208   BASOSABS 0.0 02/01/2015 2208    Hgb A1C Lab Results  Component Value Date   HGBA1C 5.5 02/03/2015            Assessment & Plan:   Peripheral Edema:  Encouraged elevation Consider TED's if persistent HCTZ 25 mg PO daily x 3 days BMET reviewed  Will reassess as needed Webb Silversmith, NP  This visit occurred during the SARS-CoV-2 public health emergency.  Safety protocols were in place, including screening questions prior to the visit, additional usage of staff PPE, and extensive cleaning of exam room while observing appropriate contact time as indicated for disinfecting solutions.

## 2020-04-17 NOTE — Patient Instructions (Signed)
Peripheral Edema  Peripheral edema is swelling that is caused by a buildup of fluid. Peripheral edema most often affects the lower legs, ankles, and feet. It can also develop in the arms, hands, and face. The area of the body that has peripheral edema will look swollen. It may also feel heavy or warm. Your clothes may start to feel tight. Pressing on the area may make a temporary dent in your skin. You may not be able to move your swollen arm or leg as much as usual. There are many causes of peripheral edema. It can happen because of a complication of other conditions such as congestive heart failure, kidney disease, or a problem with your blood circulation. It also can be a side effect of certain medicines or because of an infection. It often happens to women during pregnancy. Sometimes, the cause is not known. Follow these instructions at home: Managing pain, stiffness, and swelling  Raise (elevate) your legs while you are sitting or lying down.  Move around often to prevent stiffness and to lessen swelling.  Do not sit or stand for long periods of time.  Wear support stockings as told by your health care provider.   Medicines  Take over-the-counter and prescription medicines only as told by your health care provider.  Your health care provider may prescribe medicine to help your body get rid of excess water (diuretic). General instructions  Pay attention to any changes in your symptoms.  Follow instructions from your health care provider about limiting salt (sodium) in your diet. Sometimes, eating less salt may reduce swelling.  Moisturize skin daily to help prevent skin from cracking and draining.  Keep all follow-up visits as told by your health care provider. This is important. Contact a health care provider if you have:  A fever.  Edema that starts suddenly or is getting worse, especially if you are pregnant or have a medical condition.  Swelling in only one leg.  Increased  swelling, redness, or pain in one or both of your legs.  Drainage or sores at the area where you have edema. Get help right away if you:  Develop shortness of breath, especially when you are lying down.  Have pain in your chest or abdomen.  Feel weak.  Feel faint. Summary  Peripheral edema is swelling that is caused by a buildup of fluid. Peripheral edema most often affects the lower legs, ankles, and feet.  Move around often to prevent stiffness and to lessen swelling. Do not sit or stand for long periods of time.  Pay attention to any changes in your symptoms.  Contact a health care provider if you have edema that starts suddenly or is getting worse, especially if you are pregnant or have a medical condition.  Get help right away if you develop shortness of breath, especially when lying down. This information is not intended to replace advice given to you by your health care provider. Make sure you discuss any questions you have with your health care provider. Document Revised: 10/13/2017 Document Reviewed: 10/13/2017 Elsevier Patient Education  2021 [employer].

## 2020-04-25 ENCOUNTER — Other Ambulatory Visit: Payer: Self-pay

## 2020-04-25 ENCOUNTER — Ambulatory Visit: Payer: Medicare Other | Admitting: Internal Medicine

## 2020-04-25 ENCOUNTER — Encounter: Payer: Self-pay | Admitting: Internal Medicine

## 2020-04-25 VITALS — BP 152/82 | HR 88 | Resp 16 | Wt 141.2 lb

## 2020-04-25 DIAGNOSIS — F0150 Vascular dementia without behavioral disturbance: Secondary | ICD-10-CM

## 2020-04-25 DIAGNOSIS — N1832 Chronic kidney disease, stage 3b: Secondary | ICD-10-CM

## 2020-04-25 DIAGNOSIS — K5900 Constipation, unspecified: Secondary | ICD-10-CM | POA: Diagnosis not present

## 2020-04-25 DIAGNOSIS — M810 Age-related osteoporosis without current pathological fracture: Secondary | ICD-10-CM | POA: Diagnosis not present

## 2020-04-25 DIAGNOSIS — I872 Venous insufficiency (chronic) (peripheral): Secondary | ICD-10-CM | POA: Insufficient documentation

## 2020-04-25 NOTE — Progress Notes (Signed)
Subjective:    Patient ID: Bridget Nolan, female    DOB: February 14, 1921, 87 y.o.   MRN: [id number]  HPI Visit in her assisted living apartment for review of chronic health conditions Reviewed status with Luellen Pucker RN  Doing well here Has settled in well Walks without device (but uses pole when outside) She enjoys walking outside  Independent with ADLs Remains continent  Some calf edema recently 3 days of HCTZ--may have helped some No SOB---but stamina is not as good No chest pain No dizziness No palpitations  Past GFR in 30's No recent checks  Stable mild memory issues  No current outpatient medications on file prior to visit.   No current facility-administered medications on file prior to visit.    Allergies  Allergen Reactions  . Simvastatin     Other reaction(s): Muscle Pain  . Levofloxacin Rash  . Penicillins Rash    Has patient had a PCN reaction causing immediate rash, facial/tongue/throat swelling, SOB or lightheadedness with hypotension: Yes Has patient had a PCN reaction causing severe rash involving mucus membranes or skin necrosis: No Has patient had a PCN reaction that required hospitalization Yes Has patient had a PCN reaction occurring within the last 10 years: No If all of the above answers are "NO", then may proceed with Cephalosporin use.    Past Medical History:  Diagnosis Date  . Cancer ([place]) skin  . Cataract   . Hearing loss   . Osteoporosis   . Vascular dementia [facility])     Past Surgical History:  Procedure Laterality Date  . BREAST BIOPSY Left neg  . BREAST CYST ASPIRATION Bilateral   . EYE SURGERY Bilateral   . HERNIA REPAIR    . INTRAMEDULLARY (IM) NAIL INTERTROCHANTERIC Left 02/02/2015   Procedure: INTRAMEDULLARY (IM) NAIL INTERTROCHANTRIC;  Surgeon: Claud Kelp, MD;  Location: [facility] ORS;  Service: Orthopedics;  Laterality: Left;    Family History  Problem Relation Age of Onset  . Thyroid cancer Mother   . Kidney disease Brother    . Diabetes Neg Hx     Social History   Socioeconomic History  . Marital status: Widowed    Spouse name: Not on file  . Number of children: 2  . Years of education: Not on file  . Highest education level: Not on file  Occupational History  . Occupation: Housewife  Tobacco Use  . Smoking status: Former Smoker    Quit date: 02/1943    Years since quitting: 77.2  . Smokeless tobacco: Never Used  . Tobacco comment: Social Smoker  Substance and Sexual Activity  . Alcohol use: Yes    Comment: small cups of wine at times  . Drug use: Never  . Sexual activity: Not on file  Other Topics Concern  . 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 Can go as long as a week at times Voids fine No  sig back or joint pains    Objective:   Physical Exam Constitutional:      Appearance: Normal appearance.  Cardiovascular:     Rate and Rhythm: Normal rate and regular rhythm.     Heart sounds: No murmur heard. No gallop.   Pulmonary:     Effort: Pulmonary effort is normal.     Breath sounds: Normal breath sounds. No wheezing or rales.  Abdominal:     Palpations: Abdomen is soft.     Tenderness: There is no abdominal tenderness.  Musculoskeletal:     Cervical back: Neck supple.     Comments: Slight non pitting calf edema  Lymphadenopathy:     Cervical: No cervical adenopathy.  Neurological:     Mental Status: She is alert.  Psychiatric:         Mood and Affect: Mood normal.        Behavior: Behavior normal.            Assessment & Plan:

## 2020-04-25 NOTE — Assessment & Plan Note (Signed)
Likely mostly age related No action about this

## 2020-04-25 NOTE — Assessment & Plan Note (Signed)
Will restart senna-s 1 daily

## 2020-04-25 NOTE — Assessment & Plan Note (Signed)
She hasn't been taking vitamin D, etc She walks regularly though

## 2020-04-25 NOTE — Assessment & Plan Note (Signed)
Mild and stable Does well with support of staff in AL for food/supervision

## 2020-04-25 NOTE — Assessment & Plan Note (Signed)
Mild Will see if she can handle using knee high support hose or socks

## 2020-05-23 ENCOUNTER — Ambulatory Visit: Payer: Self-pay | Admitting: Nurse Practitioner

## 2020-07-26 ENCOUNTER — Other Ambulatory Visit: Payer: Self-pay

## 2020-07-26 ENCOUNTER — Non-Acute Institutional Stay: Payer: Medicare Other | Admitting: Internal Medicine

## 2020-07-26 DIAGNOSIS — M810 Age-related osteoporosis without current pathological fracture: Secondary | ICD-10-CM | POA: Diagnosis not present

## 2020-07-26 DIAGNOSIS — F0150 Vascular dementia without behavioral disturbance: Secondary | ICD-10-CM | POA: Diagnosis not present

## 2020-07-26 DIAGNOSIS — N1832 Chronic kidney disease, stage 3b: Secondary | ICD-10-CM

## 2020-07-31 ENCOUNTER — Encounter: Payer: Self-pay | Admitting: Internal Medicine

## 2020-07-31 NOTE — Assessment & Plan Note (Signed)
No medications Appreciate ALF care

## 2020-07-31 NOTE — Assessment & Plan Note (Signed)
Kidney function reviewed We will monitor

## 2020-07-31 NOTE — Progress Notes (Signed)
Subjective:    Patient ID: Bridget Nolan, female    DOB: [birth year], 87 y.o.   MRN: [id number]  HPI  Resident seen in a PT 306 No new concerns from staff or resident She sleeps well and does not nap throughout the day.  Her appetite is good and her weight is stable.  She does have some shortness of breath with exertion but denies chest pain.  Her bowels are moving okay.  She denies urinary incontinence.  She denies pain.  She walks with a cane.  She is independent with ADLs.  She reports her mood is good.  Osteoporosis: She is not currently taking any calcium or vitamin D OTC.  She does get weightbearing exercise and daily.  CKD 3: Her last creatinine was 1.00, GFR 48, 06/2020.  She is not currently on anything for renal protection at this time.  Vascular Dementia: Mild cognitive and functional needs.  She is not taking any medications for this.  Review of Systems  Past Medical History:  Diagnosis Date   Cancer ([place]) skin   Cataract    Hearing loss    Osteoporosis    Vascular dementia ([place])     No current outpatient medications on file.   No current facility-administered medications for this visit.    Allergies  Allergen Reactions   Simvastatin     Other reaction(s): Muscle Pain   Levofloxacin Rash   Penicillins Rash    Has patient had a PCN reaction causing immediate rash, facial/tongue/throat swelling, SOB or lightheadedness with hypotension: Yes Has patient had a PCN reaction causing severe rash involving mucus membranes or skin necrosis: No Has patient had a PCN reaction that required hospitalization Yes Has patient had a PCN reaction occurring within the last 10 years: No If all of the above answers are "NO", then may proceed with Cephalosporin use.    Family History  Problem Relation Age of Onset   Thyroid cancer Mother    Kidney disease Brother    Diabetes Neg Hx     Social History   Socioeconomic History   Marital status: Widowed    Spouse name: Not on  file   Number of children: 2   Years of education: Not on file   Highest education level: Not on file  Occupational History   Occupation: Housewife  Tobacco Use   Smoking status: Former    Pack years: 0.00    Types: Cigarettes    Quit date: 02/1943    Years since quitting: 77.5   Smokeless tobacco: Never   Tobacco comments:    Social Smoker  Substance and Sexual Activity   Alcohol use: Yes    Comment: small cups of wine at times   Drug use: Never   Sexual activity: Not on file  Other Topics Concern   Not on file  Social History Narrative   Widowed ~2008   2 daughters   Has living will   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  HEENT: Denies eye pain, eye redness, ear pain, ringing in the  ears, wax buildup, runny nose, nasal congestion, bloody nose, or sore throat. Respiratory: Patient reports shortness of breath with exertion.  Denies difficulty breathing, cough or sputum production.   Cardiovascular: Denies chest pain, chest tightness, palpitations or swelling in the hands or feet.  Gastrointestinal: Denies abdominal pain, bloating, constipation, diarrhea or blood in the stool.  GU: Denies urgency, frequency, pain with urination, burning sensation, blood in urine, odor or discharge. Musculoskeletal: Denies decrease in range of motion, difficulty with gait, muscle pain or joint pain and swelling.  Skin: Denies redness, rashes, lesions or ulcercations.  Neurological: Denies dizziness, difficulty with memory, difficulty with speech or problems with  balance and coordination.  Psych: Denies anxiety, depression, SI/HI.  No other specific complaints in a complete review of systems (except as listed in HPI above).     Objective:   Physical Exam   BP (!) 145/77   Pulse 97   Temp (!) 97.5 F (36.4 C)   Resp 20   Wt 141 lb 12.8 oz (64.3 kg)   BMI 25.12 kg/m  Wt Readings from Last 3 Encounters:  07/31/20 141 lb 12.8 oz (64.3 kg)  04/25/20 141 lb 3.2 oz (64 kg)  04/17/20 141 lb 6.4 oz (64.1 kg)    General: Appears her stated age, well developed, well nourished in NAD. Neck:  Neck supple, trachea midline. No masses, lumps or thyromegaly present.  Cardiovascular: Normal rate and rhythm. S1,S2 noted.  No murmur, rubs or gallops noted. No JVD or BLE edema.  Pulmonary/Chest: Normal effort and positive vesicular breath sounds. No respiratory distress. No wheezes, rales or ronchi noted.  Abdomen: Soft and nontender. Normal bowel sounds.  Neurological: Alert and oriented. Psychiatric: Mood and affect normal. Behavior is normal. Judgment and thought content normal.     BMET    Component Value Date/Time   NA 140 04/24/2019 0000   K 4.6 04/24/2019 0000   CL 103 04/24/2019 0000   CO2 25 (A) 04/24/2019 0000   GLUCOSE 121 (H) 02/04/2015 0426   BUN 23 (A) 04/24/2019 0000   CREATININE 1.2 (A) 04/24/2019 0000   CREATININE 0.65 02/04/2015 0426   CALCIUM 10.0 04/24/2019 0000   GFRNONAA 38 04/24/2019 0000   GFRAA 44 04/24/2019 0000    Lipid Panel  No results found for: CHOL, TRIG, HDL, CHOLHDL, VLDL, LDLCALC  CBC    Component Value Date/Time   WBC 5.2 04/13/2019 0000   WBC 5.2 04/13/2019 0000   WBC 7.3 02/04/2015 0426   RBC 3.99 04/13/2019 0000   HGB 13.4 04/13/2019 0000   HCT 40 04/13/2019 0000   PLT 226 04/13/2019 0000   MCV 98.2 02/04/2015 0426   MCH 34.0 02/04/2015 0426   MCHC 34.6 02/04/2015 0426   RDW 12.3 02/04/2015 0426   LYMPHSABS 0.5 (L) 02/01/2015 2208   MONOABS 0.5 02/01/2015 2208   EOSABS 0.1 02/01/2015 2208    BASOSABS 0.0 02/01/2015 2208    Hgb A1C Lab Results  Component Value Date   HGBA1C 5.5 02/03/2015           Assessment & Plan:    Webb Silversmith, NP This visit occurred during the SARS-CoV-2 public health emergency.  Safety protocols were in place, including screening questions prior to the visit, additional usage of staff PPE, and extensive cleaning of exam room while observing appropriate contact time as indicated for disinfecting solutions.

## 2020-07-31 NOTE — Assessment & Plan Note (Signed)
Encouraged daily weight bearing exercise 

## 2020-07-31 NOTE — Patient Instructions (Signed)
Chronic Kidney Disease, Adult Chronic kidney disease is when lasting damage happens to the kidneys slowly over a long time. The kidneys help to: Make pee (urine). Make hormones. Keep the right amount of fluids and chemicals in the body. Most often, this disease does not go away. You must take steps to help keep the kidney damage from getting worse. If steps are not taken, the kidneys might stop working forever. What are the causes? Diabetes. High blood pressure. Diseases that affect the heart and blood vessels. Other kidney diseases. Diseases of the body's disease-fighting system. A problem with the flow of pee. Infections of the organs that make pee, store it, and take it out of the body. Swelling or irritation of your blood vessels. What increases the risk? Getting older. Having someone in your family who has kidney disease or kidney failure. Having a disease caused by genes. Taking medicines often that harm the kidneys. Being near or having contact with harmful substances. Being very overweight. Using tobacco now or in the past. What are the signs or symptoms? Feeling very tired. Having a swollen face, legs, ankles, or feet. Feeling like you may vomit or vomiting. Not feeling hungry. Being confused or not able to focus. Twitches and cramps in the leg muscles or other muscles. Dry, itchy skin. A taste of metal in your mouth. Making less pee, or making more pee. Shortness of breath. Trouble sleeping. You may also become anemic or get weak bones. Anemic means there is not enough red blood cells or hemoglobin in your blood. You may get symptoms slowly. You may not notice them until the kidney damage gets very bad. How is this treated? Often, there is no cure for this disease. Treatment can help with symptoms and help keep the disease from getting worse. You may need to: Avoid alcohol. Avoid foods that are high in salt, potassium, phosphorous, and protein. Take medicines for  symptoms and to help control other conditions. Have dialysis. This treatment gets harmful waste out of your body. Treat other problems that cause your kidney disease or make it worse. Follow these instructions at home: Medicines Take over-the-counter and prescription medicines only as told by your doctor. Do not take any new medicines, vitamins, or supplements unless your doctor says it is okay. Lifestyle  Do not smoke or use any products that contain nicotine or tobacco. If you need help quitting, ask your doctor. If you drink alcohol: Limit how much you use to: 0-1 drink a day for women who are not pregnant. 0-2 drinks a day for men. Know how much alcohol is in your drink. In the [place] one drink equals one 12 oz bottle of beer (355 mL), one 5 oz glass of wine (148 mL), or one 1 oz glass of hard liquor (44 mL). Stay at a healthy weight. If you need help losing weight, ask your doctor. General instructions  Follow instructions from your doctor about what you cannot eat or drink. Track your blood pressure at home. Tell your doctor about any changes. If you have diabetes, track your blood sugar. Exercise at least 30 minutes a day, 5 days a week. Keep your shots (vaccinations) up to date. Keep all follow-up visits. Where to find more information American Association of Kidney Patients: www.aakp.org National Kidney Foundation: www.kidney.org American Kidney Fund: www.akfinc.org Life Options: www.lifeoptions.org Kidney School: www.kidneyschool.org Contact a doctor if: Your symptoms get worse. You get new symptoms. Get help right away if: You get symptoms of end-stage kidney disease. These   include: Headaches. Losing feeling in your hands or feet. Easy bruising. Having hiccups often. Chest pain. Shortness of breath. Lack of menstrual periods, in women. You have a fever. You make less pee than normal. You have pain or you bleed when you pee or poop. These symptoms may be an  emergency. Get help right away. Call your local emergency services (911 in the [place] Do not wait to see if the symptoms will go away. Do not drive yourself to the hospital. Summary Chronic kidney disease is when lasting damage happens to the kidneys slowly over a long time. Causes of this disease include diabetes and high blood pressure. Often, there is no cure for this disease. Treatment can help symptoms and help keep the disease from getting worse. Treatment may involve lifestyle changes, medicines, and dialysis. This information is not intended to replace advice given to you by your health care provider. Make sure you discuss any questions you have with your health care provider. Document Revised: 04/26/2019 Document Reviewed: 04/26/2019 Elsevier Patient Education  2022 Elsevier Inc.  

## 2020-08-07 ENCOUNTER — Encounter: Payer: Self-pay | Admitting: Intensive Care

## 2020-08-07 ENCOUNTER — Emergency Department: Payer: Medicare Other

## 2020-08-07 ENCOUNTER — Emergency Department
Admission: EM | Admit: 2020-08-07 | Discharge: 2020-08-07 | Disposition: A | Discharge status: Home / Self Care | Payer: Medicare Other | Attending: Emergency Medicine | Admitting: Emergency Medicine

## 2020-08-07 ENCOUNTER — Other Ambulatory Visit: Payer: Self-pay

## 2020-08-07 DIAGNOSIS — S8992XA Unspecified injury of left lower leg, initial encounter: Secondary | ICD-10-CM | POA: Diagnosis present

## 2020-08-07 DIAGNOSIS — Z859 Personal history of malignant neoplasm, unspecified: Secondary | ICD-10-CM | POA: Diagnosis not present

## 2020-08-07 DIAGNOSIS — W010XXA Fall on same level from slipping, tripping and stumbling without subsequent striking against object, initial encounter: Secondary | ICD-10-CM | POA: Diagnosis not present

## 2020-08-07 DIAGNOSIS — N1832 Chronic kidney disease, stage 3b: Secondary | ICD-10-CM | POA: Diagnosis not present

## 2020-08-07 DIAGNOSIS — Z87891 Personal history of nicotine dependence: Secondary | ICD-10-CM | POA: Insufficient documentation

## 2020-08-07 DIAGNOSIS — M545 Low back pain, unspecified: Secondary | ICD-10-CM | POA: Insufficient documentation

## 2020-08-07 DIAGNOSIS — S81812A Laceration without foreign body, left lower leg, initial encounter: Secondary | ICD-10-CM | POA: Diagnosis not present

## 2020-08-07 DIAGNOSIS — Y92129 Unspecified place in nursing home as the place of occurrence of the external cause: Secondary | ICD-10-CM | POA: Diagnosis not present

## 2020-08-07 DIAGNOSIS — F0150 Vascular dementia without behavioral disturbance: Secondary | ICD-10-CM | POA: Insufficient documentation

## 2020-08-07 DIAGNOSIS — Y92009 Unspecified place in unspecified non-institutional (private) residence as the place of occurrence of the external cause: Secondary | ICD-10-CM

## 2020-08-07 DIAGNOSIS — W19XXXA Unspecified fall, initial encounter: Secondary | ICD-10-CM

## 2020-08-07 MED ORDER — ACETAMINOPHEN 325 MG PO TABS
650.0000 mg | ORAL_TABLET | Freq: Once | ORAL | Status: AC
  Administered 2020-08-07: 650 mg via ORAL
  Filled 2020-08-07: qty 2

## 2020-08-07 MED ORDER — HYDROCHLOROTHIAZIDE 25 MG PO TABS
25.0000 mg | ORAL_TABLET | Freq: Once | ORAL | Status: AC
  Administered 2020-08-07: 25 mg via ORAL
  Filled 2020-08-07: qty 1

## 2020-08-07 NOTE — ED Notes (Signed)
Se triage note  Presents s/p fall  Tripped and fell  States she stays at [facility]  Having pain to right hip and lower back

## 2020-08-07 NOTE — ED Provider Notes (Signed)
[facility] Emergency Department Provider Note  ____________________________________________   Event Date/Time   First MD Initiated Contact with Patient 08/07/20 1649     (approximate)  I have reviewed the triage vital signs and the nursing notes.   HISTORY  Chief Complaint Fall  HPI Bridget Nolan is a 87 y.o. female with the below medical history including dementia, presents from [facility] independent living, with a mechanical fall.  Patient fell with complaints of pain to the lower back as well as a skin tear of the left lower extremity.  There is no reported head injury, but patient is alert and at baseline upon arrival.  Past Medical History:  Diagnosis Date   Cancer [facility]) skin   Cataract    Hearing loss    Osteoporosis    Vascular dementia [facility])     Patient Active Problem List   Diagnosis Date Noted   Stage 3b chronic kidney disease ([place]) 04/25/2020   Vascular dementia ([place])    Bilateral hearing loss 04/20/2019   Osteoporosis without current pathological fracture 04/20/2019    Past Surgical History:  Procedure Laterality Date   BREAST BIOPSY Left neg   BREAST CYST ASPIRATION Bilateral    EYE SURGERY Bilateral    HERNIA REPAIR     INTRAMEDULLARY (IM) NAIL INTERTROCHANTERIC Left 02/02/2015   Procedure: INTRAMEDULLARY (IM) NAIL INTERTROCHANTRIC;  Surgeon: Claud Kelp, MD;  Location: [facility] ORS;  Service: Orthopedics;  Laterality: Left;    Prior to Admission medications   Medication Sig Start Date End Date Taking? Authorizing Provider  hydrochlorothiazide (HYDRODIURIL) 25 MG tablet Take 25 mg by mouth daily.   Yes [provider]    Allergies Simvastatin, Levofloxacin, and Penicillins  Family History  Problem Relation Age of Onset   Thyroid cancer Mother    Kidney disease Brother    Diabetes Neg Hx     Social History Social History   Tobacco Use   Smoking status: Former    Types: Cigarettes    Quit date: 02/1943     Years since quitting: 77.5   Smokeless tobacco: Never   Tobacco comments:    Social Smoker  Substance Use Topics   Alcohol use: Yes    Comment: occ   Drug use: Never    Review of Systems Constitutional: No fever/chills Eyes: No visual changes. ENT: No sore throat. Cardiovascular: Denies chest pain. Respiratory: Denies shortness of breath. Gastrointestinal: No abdominal pain.  No nausea, no vomiting.  No diarrhea.  No constipation. Genitourinary: Negative for dysuria. Musculoskeletal: Negative for back pain. Skin: Negative for rash.  Laceration to the left lower leg as above. Neurological: Negative for headaches, focal weakness or numbness.  ____________________________________________   PHYSICAL EXAM:  VITAL SIGNS: ED Triage Vitals  Enc Vitals Group     BP 08/07/20 1529 (!) 173/92     Pulse Rate 08/07/20 1529 84     Resp 08/07/20 1529 18     Temp 08/07/20 1529 98.3 F (36.8 C)     Temp Source 08/07/20 1529 Oral     SpO2 08/07/20 1529 95 %     Weight 08/07/20 1532 130 lb (59 kg)     Height 08/07/20 1532 5\' 3"  (1.6 m)     Head Circumference --      Peak Flow --      Pain Score 08/07/20 1530 2     Pain Loc --      Pain Edu? --  Excl. in [place]? --     Constitutional: Alert and oriented. Well appearing and in no acute distress. Eyes: Conjunctivae are normal. PERRL. EOMI. Head: Atraumatic. Nose: No congestion/rhinnorhea. Mouth/Throat: Mucous membranes are moist.  Oropharynx non-erythematous. Neck: No stridor.   Cardiovascular: Normal rate, regular rhythm. Grossly normal heart sounds.  Good peripheral circulation. Respiratory: Normal respiratory effort.  No retractions. Lungs CTAB. Gastrointestinal: Soft and nontender. No distention. No abdominal bruits. No CVA tenderness. Musculoskeletal: No lower extremity tenderness nor edema.  No joint effusions. Neurologic:  Normal speech and language. No gross focal neurologic deficits are appreciated. No gait  instability. Skin:  Skin is warm, dry and intact. No rash noted.  Lower extremity with a 4 cm laceration noted. Psychiatric: Mood and affect are normal. Speech and behavior are normal.  ____________________________________________   LABS (all labs ordered are listed, but only abnormal results are displayed)  Labs Reviewed - No data to display ____________________________________________  EKG   ____________________________________________  RADIOLOGY I, Melvenia Needles, personally viewed and evaluated these images (plain radiographs) as part of my medical decision making, as well as reviewing the written report by the radiologist.  ED MD interpretation:  agree with report  Official radiology report(s): No results found.  ____________________________________________   PROCEDURES  Procedure(s) performed (including Critical Care):  Marland KitchenMarland KitchenLaceration Repair  Date/Time: 08/09/2020 4:48 PM Performed by: Melvenia Needles, PA-C Authorized by: Melvenia Needles, PA-C   Consent:    Consent obtained:  Verbal   Consent given by:  Patient   Risks, benefits, and alternatives were discussed: yes     Risks discussed:  Pain and poor wound healing Universal protocol:    Procedure explained and questions answered to patient or proxy's satisfaction: yes     Patient identity confirmed:  Verbally with patient Anesthesia:    Anesthesia method:  None Laceration details:    Location:  Leg   Leg location:  L lower leg   Length (cm):  4   Depth (mm):  5 Pre-procedure details:    Preparation:  Patient was prepped and draped in usual sterile fashion Exploration:    Limited defect created (wound extended): no     Hemostasis achieved with:  Direct pressure   Contaminated: no   Treatment:    Area cleansed with:  Saline   Amount of cleaning:  Standard   Irrigation solution:  Sterile saline   Debridement:  None   Undermining:  None   Scar revision: no   Skin repair:     Repair method:  Steri-Strips   Number of Steri-Strips:  4 Approximation:    Approximation:  Close Repair type:    Repair type:  Simple Post-procedure details:    Dressing:  Non-adherent dressing   Procedure completion:  Tolerated well, no immediate complications  HCTZ 25 mg PO APAP 650 mg PO ____________________________________________   INITIAL IMPRESSION / ASSESSMENT AND PLAN / ED COURSE  As part of my medical decision making, I reviewed the following data within the electronic MEDICAL RECORD NUMBER Radiograph reviewed WNL and Notes from prior ED visits   Patient ED evaluation of injury sustained following mechanical fall at her assisted living facility.  Patient presents without complaint of head injury, but is noted to have a skin tear to the left lower extremity.  She was evaluated for complaints in the ED with a normal and reassuring images of the pelvis and lumbar spine.  She is discharged stable condition after wound repair is provided.  She  will follow-up with her primary provider for ongoing symptoms.  Return precautions have been reviewed. ____________________________________________   FINAL CLINICAL IMPRESSION(S) / ED DIAGNOSES  Final diagnoses:  Fall  Fall in home, initial encounter  Skin tear of left lower leg without complication, initial encounter     ED Discharge Orders     None        Note:  This document was prepared using Dragon voice recognition software and may include unintentional dictation errors.    Melvenia Needles, PA-C 08/16/20 1152    Delman Kitten, MD 08/20/20 [phone]

## 2020-08-07 NOTE — ED Triage Notes (Signed)
Pt comes into the ED via [facility] , trip and fell c/o right lower back pain, skin tear to the LLE, bleeding controlled. Unsure if she hit her head, pt has a hx of dementia, pt is alert on arrival  170/82 95-96%RA HR84

## 2020-08-07 NOTE — Discharge Instructions (Addendum)
Your exam and CT scans are normal and reassuring following your fall. Keep the wound clean, dry, and covered. Take OTC Tylenol as needed for pain.

## 2020-08-09 ENCOUNTER — Non-Acute Institutional Stay: Payer: Medicare Other | Admitting: Internal Medicine

## 2020-08-09 VITALS — BP 140/90 | HR 78 | Temp 97.6°F | Wt 131.5 lb

## 2020-08-09 DIAGNOSIS — W19XXXD Unspecified fall, subsequent encounter: Secondary | ICD-10-CM | POA: Diagnosis not present

## 2020-08-09 DIAGNOSIS — Y92129 Unspecified place in nursing home as the place of occurrence of the external cause: Secondary | ICD-10-CM

## 2020-08-09 DIAGNOSIS — S81812A Laceration without foreign body, left lower leg, initial encounter: Secondary | ICD-10-CM | POA: Diagnosis not present

## 2020-08-09 DIAGNOSIS — M545 Low back pain, unspecified: Secondary | ICD-10-CM | POA: Diagnosis not present

## 2020-08-15 ENCOUNTER — Encounter: Payer: Self-pay | Admitting: Internal Medicine

## 2020-08-15 NOTE — Progress Notes (Signed)
Subjective:    Patient ID: Bridget Nolan, female    DOB: [birth year], 87 y.o.   MRN: [id number]  HPI  Resident seen in [address] for ER follow up.  She went to the ER 7/6 after mechanical fall in assisted living.  She complained of bilateral low back pain and skin tear to her left lower extremity.  X-ray of the right hip was negative for acute fracture.  X-ray of the lumbar spine did not show any acute fractures.  She was treated with Tylenol and sent back to ALF.  Since discharge, RN reports she complains of some pain to her lower back.  Her skin tear has been treated per standing orders. Resident reports she is sore, worse with changing positions but as long as she is sitting or laying still she is fine.  She has continued to go down for meals.  She has been taking Tylenol only as needed.   Review of Systems     Past Medical History:  Diagnosis Date   Cancer ([place]) skin   Cataract    Hearing loss    Osteoporosis    Vascular dementia ([place])     Current Outpatient Medications  Medication Sig Dispense Refill   hydrochlorothiazide (HYDRODIURIL) 25 MG tablet Take 25 mg by mouth daily.     No current facility-administered medications for this visit.    Allergies  Allergen Reactions   Simvastatin     Other reaction(s): Muscle Pain   Levofloxacin Rash   Penicillins Rash    Has patient had a PCN reaction causing immediate rash, facial/tongue/throat swelling, SOB or lightheadedness with hypotension: Yes Has patient had a PCN reaction causing severe rash involving mucus membranes or skin necrosis: No Has patient had a PCN reaction that required hospitalization Yes Has patient had a PCN reaction occurring within the last 10 years: No If all of the above answers are "NO", then may proceed with Cephalosporin use.    Family History  Problem Relation Age of Onset   Thyroid cancer Mother    Kidney disease Brother    Diabetes Neg Hx     Social History   Socioeconomic History    Marital status: Widowed    Spouse name: Not on file   Number of children: 2   Years of education: Not on file   Highest education level: Not on file  Occupational History   Occupation: Housewife  Tobacco Use   Smoking status: Former    Types: Cigarettes    Quit date: 02/1943    Years since quitting: 77.5   Smokeless tobacco: Never   Tobacco comments:    Social Smoker  Substance and Sexual Activity   Alcohol use: Yes    Comment: occ   Drug use: Never   Sexual activity: Not on file  Other Topics Concern   Not on file  Social History Narrative   Widowed ~2008   2 daughters   Has living will   [place]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  Respiratory: Denies difficulty breathing, shortness of breath, cough  or sputum production.   Cardiovascular: Denies chest pain, chest tightness, palpitations or swelling in the hands or feet.  Musculoskeletal: Patient reports low back pain denies decrease in range of motion, difficulty with gait, or joint swelling.  Skin: Patient reports skin tear to left lower extremity.   Neurological: Denies problems with balance and coordination.    No other specific complaints in a complete review of systems (except as listed in HPI above).  Objective:   Physical Exam  BP 140/90   Pulse 78   Temp 97.6 F (36.4 C)   Wt 131 lb 8 oz (59.6 kg)   SpO2 98%   BMI 23.29 kg/m  Wt Readings from Last 3 Encounters:  08/15/20 131 lb 8 oz (59.6 kg)  08/07/20 130 lb (59 kg)  07/31/20 141 lb 12.8 oz (64.3 kg)    General: Appears  her stated age, in NAD. Skin: Skin tear to LLE with surrounding hematoma.  Recently covered with dressing. HEENT: Head: normal shape and size; Eyes: sclera white and EOMs intact;  Cardiovascular: Normal rate and rhythm. S1,S2 noted.  No murmur, rubs or gallops noted.  Pulmonary/Chest: Normal effort and positive vesicular breath sounds. No respiratory distress. No wheezes, rales or ronchi noted.  Musculoskeletal: She has noted difficulty getting from a sitting to a standing position.  She denies bony tenderness noted over the thoracic or lumbar spine.  She has some minor pain with palpation of the left and right paralumbar muscles. Neurological: Alert and oriented.    BMET    Component Value Date/Time   NA 140 04/24/2019 0000   K 4.6 04/24/2019 0000   CL 103 04/24/2019 0000   CO2 25 (A) 04/24/2019 0000   GLUCOSE 121 (H) 02/04/2015 0426   BUN 23 (A) 04/24/2019 0000   CREATININE 1.2 (A) 04/24/2019 0000   CREATININE 0.65 02/04/2015 0426   CALCIUM 10.0 04/24/2019 0000   GFRNONAA 38 04/24/2019 0000   GFRAA 44 04/24/2019 0000    Lipid Panel  No results found for: CHOL, TRIG, HDL, CHOLHDL, VLDL, LDLCALC  CBC    Component Value Date/Time   WBC 5.2 04/13/2019 0000   WBC 5.2 04/13/2019 0000   WBC 7.3 02/04/2015 0426   RBC 3.99 04/13/2019 0000   HGB 13.4 04/13/2019 0000   HCT 40 04/13/2019 0000   PLT 226 04/13/2019 0000   MCV 98.2 02/04/2015 0426   MCH 34.0 02/04/2015 0426   MCHC 34.6 02/04/2015 0426   RDW 12.3 02/04/2015 0426   LYMPHSABS 0.5 (L) 02/01/2015 2208   MONOABS 0.5 02/01/2015 2208   EOSABS 0.1 02/01/2015 2208   BASOSABS 0.0 02/01/2015 2208    Hgb A1C Lab Results  Component Value Date   HGBA1C 5.5 02/03/2015            Assessment & Plan:   ER Follow Up s/p Fall at [place], Acute Bilateral Low Back Pain, Skin Tear to LLE:  ER notes, labs and imaging reviewed Will schedule Tylenol 650 mg TID x 3 days Encouraged movement as this will reduce  stiffness Encouraged heat as needed  Will reassess as needed

## 2020-08-15 NOTE — Patient Instructions (Signed)

## 2020-10-17 ENCOUNTER — Encounter: Payer: Self-pay | Admitting: Internal Medicine

## 2020-10-17 ENCOUNTER — Other Ambulatory Visit: Payer: Self-pay

## 2020-10-17 ENCOUNTER — Ambulatory Visit: Payer: Medicare Other | Admitting: Internal Medicine

## 2020-10-17 DIAGNOSIS — M545 Low back pain, unspecified: Secondary | ICD-10-CM

## 2020-10-17 DIAGNOSIS — N1832 Chronic kidney disease, stage 3b: Secondary | ICD-10-CM | POA: Diagnosis not present

## 2020-10-17 DIAGNOSIS — K5900 Constipation, unspecified: Secondary | ICD-10-CM

## 2020-10-17 DIAGNOSIS — F0150 Vascular dementia without behavioral disturbance: Secondary | ICD-10-CM

## 2020-10-17 NOTE — Assessment & Plan Note (Signed)
Persistent bowel problems She now asks for a laxative Will try miralax

## 2020-10-17 NOTE — Assessment & Plan Note (Signed)
No urinary symptoms No rash to suggest shingles--and is bilateral Seems to be muscular-----and she does loosen up as the day goes on No injury  Will try tylenol at bedtime and prn in day Icy hot prn Consider PT evaluation

## 2020-10-17 NOTE — Assessment & Plan Note (Signed)
Last GFR higher---48 now No rx since BP okay and her age

## 2020-10-17 NOTE — Progress Notes (Signed)
Subjective:    Patient ID: Bridget Nolan, female    DOB: 1921-11-05, 87 y.o.   MRN: [id number]  HPI Visit in assisted living apartment for review of chronic health conditions Reviewed status with Luellen Pucker RN  Has had some back pain---both sides (though mostly on right before). Has been out of sorts Has generally been independent--but staff now helping with dressing and bathing this week Continues to walk with cane---even outside Mornings have been tough--but then she loosens up and does better No injury that she knows of Hasn't taken medication  No dysuria or hematuria Urinalysis normal No fever  No chest pain No SOB No dizziness   Current Outpatient Medications on File Prior to Visit  Medication Sig Dispense Refill   cholecalciferol (VITAMIN D3) 25 MCG (1000 UNIT) tablet Take 1,000 Units by mouth daily.     cyanocobalamin 1000 MCG tablet Take 1,000 mcg by mouth daily.     No current facility-administered medications on file prior to visit.    Allergies  Allergen Reactions   Simvastatin     Other reaction(s): Muscle Pain   Levofloxacin Rash   Penicillins Rash    Has patient had a PCN reaction causing immediate rash, facial/tongue/throat swelling, SOB or lightheadedness with hypotension: Yes Has patient had a PCN reaction causing severe rash involving mucus membranes or skin necrosis: No Has patient had a PCN reaction that required hospitalization Yes Has patient had a PCN reaction occurring within the last 10 years: No If all of the above answers are "NO", then may proceed with Cephalosporin use.    Past Medical History:  Diagnosis Date   Cancer ([place]) skin   Cataract    Hearing loss    Osteoporosis    Vascular dementia [facility])     Past Surgical History:  Procedure Laterality Date   BREAST BIOPSY Left neg   BREAST CYST ASPIRATION Bilateral    EYE SURGERY Bilateral    HERNIA REPAIR     INTRAMEDULLARY (IM) NAIL INTERTROCHANTERIC Left 02/02/2015   Procedure:  INTRAMEDULLARY (IM) NAIL INTERTROCHANTRIC;  Surgeon: Claud Kelp, MD;  Location: [facility] ORS;  Service: Orthopedics;  Laterality: Left;    Family History  Problem Relation Age of Onset   Thyroid cancer Mother    Kidney disease Brother    Diabetes Neg Hx     Social History   Socioeconomic History   Marital status: Widowed    Spouse name: Not on file   Number of children: 2   Years of education: Not on file   Highest education level: Not on file  Occupational History   Occupation: Housewife  Tobacco Use   Smoking status: Former    Types: Cigarettes    Quit date: 02/1943    Years since quitting: 77.7   Smokeless tobacco: Never   Tobacco comments:    Social Smoker  Substance and Sexual Activity   Alcohol use: Yes    Comment: occ   Drug use: Never   Sexual activity: Not on file  Other Topics Concern   Not on file  Social History Narrative   Widowed ~2008   2 daughters   Has living will   [place]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  Appetite is okay until yesterday Weight stable Sleeps okay    Objective:   Physical Exam Constitutional:      Appearance: Normal appearance.  Cardiovascular:     Rate and Rhythm: Normal rate and regular rhythm.     Heart sounds: No murmur heard.   No gallop.  Pulmonary:     Effort: Pulmonary effort is normal.     Breath sounds: Normal breath sounds. No wheezing or rales.  Abdominal:     Palpations: Abdomen is soft.     Comments: ?very slight distention  Musculoskeletal:     Cervical back: Neck supple.     Right lower leg: No edema.      Left lower leg: No edema.     Comments: Mild tenderness along upper lumbar paraspinal muscles bilaterally No spine tenderness  Skin:    Comments: No rash on back  Neurological:     General: No focal deficit present.     Mental Status: She is alert.  Psychiatric:        Mood and Affect: Mood normal.        Behavior: Behavior normal.           Assessment & Plan:

## 2020-10-17 NOTE — Assessment & Plan Note (Signed)
Mild Repeats herself some Fairly independent till this week--decline seems physical rather than cognitive

## 2020-10-18 ENCOUNTER — Emergency Department
Admission: EM | Admit: 2020-10-18 | Discharge: 2020-10-18 | Disposition: A | Discharge status: Home / Self Care | Payer: Medicare Other | Attending: Emergency Medicine | Admitting: Emergency Medicine

## 2020-10-18 DIAGNOSIS — R109 Unspecified abdominal pain: Secondary | ICD-10-CM | POA: Insufficient documentation

## 2020-10-18 DIAGNOSIS — G8929 Other chronic pain: Secondary | ICD-10-CM | POA: Insufficient documentation

## 2020-10-18 DIAGNOSIS — M549 Dorsalgia, unspecified: Secondary | ICD-10-CM | POA: Insufficient documentation

## 2020-10-18 DIAGNOSIS — Z5321 Procedure and treatment not carried out due to patient leaving prior to being seen by health care provider: Secondary | ICD-10-CM | POA: Insufficient documentation

## 2020-10-18 LAB — CBC
HCT: 38.3 % (ref 36.0–46.0)
Hemoglobin: 13.6 g/dL (ref 12.0–15.0)
MCH: 33.7 pg (ref 26.0–34.0)
MCHC: 35.5 g/dL (ref 30.0–36.0)
MCV: 94.8 fL (ref 80.0–100.0)
Platelets: 211 10*3/uL (ref 150–400)
RBC: 4.04 MIL/uL (ref 3.87–5.11)
RDW: 12.2 % (ref 11.5–15.5)
WBC: 6.7 10*3/uL (ref 4.0–10.5)
nRBC: 0 % (ref 0.0–0.2)

## 2020-10-18 LAB — BASIC METABOLIC PANEL
Anion gap: 10 (ref 5–15)
BUN: 23 mg/dL (ref 8–23)
CO2: 21 mmol/L — ABNORMAL LOW (ref 22–32)
Calcium: 9.3 mg/dL (ref 8.9–10.3)
Chloride: 96 mmol/L — ABNORMAL LOW (ref 98–111)
Creatinine, Ser: 1 mg/dL (ref 0.44–1.00)
GFR, Estimated: 51 mL/min — ABNORMAL LOW (ref >60)
Glucose, Bld: 128 mg/dL — ABNORMAL HIGH (ref 70–99)
Potassium: 4.2 mmol/L (ref 3.5–5.1)
Sodium: 127 mmol/L — ABNORMAL LOW (ref 135–145)

## 2020-10-18 NOTE — ED Triage Notes (Signed)
Pt c/o bilateral flank pain x 2 days.  Pt came POV from a facility and facility paperwork sts "c/o chronic back pain x 1.5 weeks."  Facility reported to visitor that the Pt has had limited urinary "output."

## 2020-10-19 ENCOUNTER — Encounter: Payer: Self-pay | Admitting: Emergency Medicine

## 2020-10-19 ENCOUNTER — Emergency Department: Payer: Medicare Other

## 2020-10-19 ENCOUNTER — Other Ambulatory Visit: Payer: Self-pay

## 2020-10-19 ENCOUNTER — Emergency Department
Admission: EM | Admit: 2020-10-19 | Discharge: 2020-10-19 | Disposition: A | Discharge status: Home / Self Care | Payer: Medicare Other | Attending: Emergency Medicine | Admitting: Emergency Medicine

## 2020-10-19 DIAGNOSIS — S22088A Other fracture of T11-T12 vertebra, initial encounter for closed fracture: Secondary | ICD-10-CM | POA: Diagnosis not present

## 2020-10-19 DIAGNOSIS — F0150 Vascular dementia without behavioral disturbance: Secondary | ICD-10-CM | POA: Insufficient documentation

## 2020-10-19 DIAGNOSIS — W1830XA Fall on same level, unspecified, initial encounter: Secondary | ICD-10-CM | POA: Insufficient documentation

## 2020-10-19 DIAGNOSIS — M545 Low back pain, unspecified: Secondary | ICD-10-CM

## 2020-10-19 DIAGNOSIS — Z87891 Personal history of nicotine dependence: Secondary | ICD-10-CM | POA: Diagnosis not present

## 2020-10-19 DIAGNOSIS — S3992XA Unspecified injury of lower back, initial encounter: Secondary | ICD-10-CM | POA: Diagnosis present

## 2020-10-19 DIAGNOSIS — Z859 Personal history of malignant neoplasm, unspecified: Secondary | ICD-10-CM | POA: Insufficient documentation

## 2020-10-19 DIAGNOSIS — E871 Hypo-osmolality and hyponatremia: Secondary | ICD-10-CM

## 2020-10-19 DIAGNOSIS — S32020A Wedge compression fracture of second lumbar vertebra, initial encounter for closed fracture: Secondary | ICD-10-CM

## 2020-10-19 DIAGNOSIS — N1832 Chronic kidney disease, stage 3b: Secondary | ICD-10-CM | POA: Insufficient documentation

## 2020-10-19 DIAGNOSIS — S32048A Other fracture of fourth lumbar vertebra, initial encounter for closed fracture: Secondary | ICD-10-CM | POA: Insufficient documentation

## 2020-10-19 DIAGNOSIS — S22080A Wedge compression fracture of T11-T12 vertebra, initial encounter for closed fracture: Secondary | ICD-10-CM

## 2020-10-19 DIAGNOSIS — S32028A Other fracture of second lumbar vertebra, initial encounter for closed fracture: Secondary | ICD-10-CM | POA: Diagnosis not present

## 2020-10-19 DIAGNOSIS — S32040A Wedge compression fracture of fourth lumbar vertebra, initial encounter for closed fracture: Secondary | ICD-10-CM

## 2020-10-19 LAB — URINALYSIS, COMPLETE (UACMP) WITH MICROSCOPIC
Bilirubin Urine: NEGATIVE
Glucose, UA: NEGATIVE mg/dL
Ketones, ur: 15 mg/dL — AB
Leukocytes,Ua: NEGATIVE
Nitrite: NEGATIVE
Protein, ur: NEGATIVE mg/dL
Specific Gravity, Urine: 1.02 (ref 1.005–1.030)
pH: 6 (ref 5.0–8.0)

## 2020-10-19 LAB — HEPATIC FUNCTION PANEL
ALT: 17 U/L (ref 0–44)
AST: 18 U/L (ref 15–41)
Albumin: 3.6 g/dL (ref 3.5–5.0)
Alkaline Phosphatase: 87 U/L (ref 38–126)
Bilirubin, Direct: 0.1 mg/dL (ref 0.0–0.2)
Indirect Bilirubin: 0.7 mg/dL (ref 0.3–0.9)
Total Bilirubin: 0.8 mg/dL (ref 0.3–1.2)
Total Protein: 6.2 g/dL — ABNORMAL LOW (ref 6.5–8.1)

## 2020-10-19 LAB — LIPASE, BLOOD: Lipase: 39 U/L (ref 11–51)

## 2020-10-19 MED ORDER — ACETAMINOPHEN 500 MG PO TABS
1000.0000 mg | ORAL_TABLET | Freq: Once | ORAL | Status: AC
  Administered 2020-10-19: 1000 mg via ORAL
  Filled 2020-10-19: qty 2

## 2020-10-19 MED ORDER — IOHEXOL 350 MG/ML SOLN
75.0000 mL | Freq: Once | INTRAVENOUS | Status: AC | PRN
  Administered 2020-10-19: 75 mL via INTRAVENOUS
  Filled 2020-10-19: qty 75

## 2020-10-19 NOTE — ED Notes (Signed)
Called [facility] and informed them of the order.  They said they will order and bring to patient  [id number]

## 2020-10-19 NOTE — ED Notes (Signed)
Pt ambulated to restroom with 2 assist.  

## 2020-10-19 NOTE — ED Triage Notes (Signed)
First nurse note: pt comes ems from [facility] with chronic back pain. Was seen here yesterday for same but left AMA due to the wait. Hypertensive, other vitals stable. Aox4 for EMS.

## 2020-10-19 NOTE — ED Triage Notes (Signed)
Pt via EMS from [facility]. Pt c/o chronic lower back pain. Doctor at [facility] states that they did a urinalysis for a UTI was clear on Wednesday. Denies fall in the past 3 months. Pt is A&Ox4 and NAD. Pt is HOH

## 2020-10-19 NOTE — Discharge Instructions (Addendum)
Your sodium was 127 today.  This may be because you are slightly dehydrated.  I do not think it is related to the acute pain in her low back.  You will need to have this rechecked in 2 or 3 days by your primary care physician.  Your CT today showed: 1. No acute findings in the abdomen or pelvis. 2. Benign cyst of the liver. 3. Splenic granulomata. 4. Right inguinal hernia mesh without evidence of recurrent hernia. 5. Diffuse osteopenia with mild compression deformity of the L4 vertebral body and minimal loss of height of L2.  CT of your L Spine today showed: 1. Acute or early subacute 10% superior endplate compression fracture at T12, with paraspinal edema. 2. Likely late subacute 15-20% superior endplate compression fracture at L2. 3. Likely late subacute or chronic 40% superior endplate compression fracture at L4. 4. Lumbar spondylosis, scoliosis, congenitally short pedicles, and degenerative disc disease contribute to at least moderate impingement at L3-4, L4-5, and L5-S1; and mild impingement at L1-2 and L2-3 as detailed above.

## 2020-10-19 NOTE — ED Provider Notes (Addendum)
[facility] Emergency Department Provider Note  ____________________________________________   Event Date/Time   First MD Initiated Contact with Patient 10/19/20 1338     (approximate)  I have reviewed the triage vital signs and the nursing notes.   HISTORY  Chief Complaint Back Pain   HPI Bridget Nolan is a 87 y.o. female with a past medical history of vascular dementia, osteoporosis, some hearing loss, cataracts and chronic low back pain as well as CKD who presents via EMS from assisted living facility for evaluation of some bilateral abdominal pain rating around both flanks to the back.  Patient is very poor historian secondary to dementia and unable to provide any additional details.  In addition she is very hard of hearing.  She is accompanied by close friend states that patient has been complaining of some abdominal pain rating around both sides of the back for several weeks.  She had a fall 3 to 4 weeks ago and is not clear if the symptoms started after this or not.  She has not had any new shortness of breath, cough, fever, vomiting or clear urinary symptoms.  Per friend nursing facility was concerned about constipation and she was recently started on MiraLAX and per report has been having good bowel movements.  No other history is immediately available on patient presentation.  She is at her neurological baseline per friend.  Friend notes they were in the emergency room yesterday for the symptoms but left before being seen due to long weights.  They did have blood drawn at that time.         Past Medical History:  Diagnosis Date   Cancer [facility]) skin   Cataract    Hearing loss    Osteoporosis    Vascular dementia [facility])     Patient Active Problem List   Diagnosis Date Noted   Lumbar back pain 10/17/2020   Stage 3b chronic kidney disease ([place]) 04/25/2020   Constipation 09/14/2019   Vascular dementia ([place])    Bilateral hearing loss 04/20/2019    Osteoporosis without current pathological fracture 04/20/2019    Past Surgical History:  Procedure Laterality Date   BREAST BIOPSY Left neg   BREAST CYST ASPIRATION Bilateral    EYE SURGERY Bilateral    HERNIA REPAIR     INTRAMEDULLARY (IM) NAIL INTERTROCHANTERIC Left 02/02/2015   Procedure: INTRAMEDULLARY (IM) NAIL INTERTROCHANTRIC;  Surgeon: Claud Kelp, MD;  Location: [facility] ORS;  Service: Orthopedics;  Laterality: Left;    Prior to Admission medications   Medication Sig Start Date End Date Taking? Authorizing Provider  acetaminophen (TYLENOL) 650 MG CR tablet Take 650 mg by mouth at bedtime.    [provider]  cholecalciferol (VITAMIN D3) 25 MCG (1000 UNIT) tablet Take 1,000 Units by mouth daily.    [provider]  cyanocobalamin 1000 MCG tablet Take 1,000 mcg by mouth daily.    [provider]  polyethylene glycol (MIRALAX / GLYCOLAX) 17 g packet Take 17 g by mouth daily.    [provider]    Allergies Simvastatin, Levofloxacin, and Penicillins  Family History  Problem Relation Age of Onset   Thyroid cancer Mother    Kidney disease Brother    Diabetes Neg Hx     Social History Social History   Tobacco Use   Smoking status: Former    Types: Cigarettes    Quit date: 02/1943    Years since quitting: 77.7   Smokeless tobacco: Never  Tobacco comments:    Social Smoker  Substance Use Topics   Alcohol use: Yes    Comment: occ   Drug use: Never    Review of Systems  Review of Systems  Unable to perform ROS: Dementia     ____________________________________________   PHYSICAL EXAM:  VITAL SIGNS: ED Triage Vitals  Enc Vitals Group     BP 10/19/20 1246 (!) 135/93     Pulse Rate 10/19/20 1246 95     Resp 10/19/20 1246 16     Temp 10/19/20 1246 97.8 F (36.6 C)     Temp Source 10/19/20 1246 Oral     SpO2 10/19/20 1246 94 %     Weight 10/19/20 1247 127 lb (57.6 kg)     Height 10/19/20 1247 '5\' 3"'$  (1.6 m)     Head  Circumference --      Peak Flow --      Pain Score 10/19/20 1247 10     Pain Loc --      Pain Edu? --      Excl. in [place]? --    Vitals:   10/19/20 1246 10/19/20 1410  BP: (!) 135/93 (!) 152/92  Pulse: 95 86  Resp: 16 16  Temp: 97.8 F (36.6 C)   SpO2: 94% 92%   Physical Exam Vitals and nursing note reviewed.  Constitutional:      General: She is not in acute distress.    Appearance: She is well-developed.  HENT:     Head: Normocephalic and atraumatic.     Right Ear: External ear normal.     Left Ear: External ear normal.     Nose: Nose normal.  Eyes:     Conjunctiva/sclera: Conjunctivae normal.  Cardiovascular:     Rate and Rhythm: Normal rate and regular rhythm.     Heart sounds: No murmur heard. Pulmonary:     Effort: Pulmonary effort is normal. No respiratory distress.     Breath sounds: Normal breath sounds.  Abdominal:     Palpations: Abdomen is soft.     Tenderness: There is no abdominal tenderness.  Musculoskeletal:     Cervical back: Neck supple.  Skin:    General: Skin is warm and dry.  Neurological:     Mental Status: She is alert and oriented to person, place, and time.  Psychiatric:        Mood and Affect: Mood normal.    Patient's abdomen is soft nontender throughout.  No CVA tenderness.  No overlying skin changes over the back or abdomen.  Patient has symmetric strength of her bilateral lower extremities.  Sensation is intact to light touch of bilateral lower extremities.  Mild TTP over lower T spine and L spine. None over C spine. Symmetric upper extremity strength. CN II-XII grossly intact.  ____________________________________________   LABS (all labs ordered are listed, but only abnormal results are displayed)  Labs Reviewed  URINALYSIS, COMPLETE (UACMP) WITH MICROSCOPIC - Abnormal; Notable for the following components:      Result Value   Hgb urine dipstick TRACE (*)    Ketones, ur 15 (*)    Bacteria, UA RARE (*)    All other components  within normal limits  HEPATIC FUNCTION PANEL - Abnormal; Notable for the following components:   Total Protein 6.2 (*)    All other components within normal limits  LIPASE, BLOOD   ____________________________________________  EKG  ____________________________________________  RADIOLOGY  ED MD interpretation: CT abdomen pelvis shows no evidence of  kidney stone, pyelonephritis, diverticulitis or other acute abdominal pelvic process.  There is a benign cyst in the liver and some granuloma in the spleen.  There is also right inguinal hernia mesh that shows no acute complication.  There is also acute osteopenia noted and a mild compression deformity of L4 concerning for compression fracture given history of fall couple weeks ago.  CT L-spine reformatted shows an acute compression fracture at T12 with some paraspinal edema as well as L2 and L4 as well as spondylosis and scoliosis and some degenerative changes as well.  No other acute abnormalities or injuries noted.  Official radiology report(s): CT ABDOMEN PELVIS W CONTRAST  Result Date: 10/19/2020 CLINICAL DATA:  Abdominal pain. EXAM: CT ABDOMEN AND PELVIS WITH CONTRAST TECHNIQUE: Multidetector CT imaging of the abdomen and pelvis was performed using the standard protocol following bolus administration of intravenous contrast. CONTRAST:  41m OMNIPAQUE IOHEXOL 350 MG/ML SOLN COMPARISON:  None. FINDINGS: Lower chest: Bibasilar pulmonary scarring. Hepatobiliary: Well-circumscribed simple cyst in the left lobe of the liver measures up to 1.6 cm in maximum diameter. No biliary dilatation. The gallbladder appears normal. Pancreas: Unremarkable. No pancreatic ductal dilatation or surrounding inflammatory changes. Spleen: Normal splenic size. There are a few calcified granulomata in the spleen. Adrenals/Urinary Tract: Adrenal glands are unremarkable. Kidneys are normal, without renal calculi, focal lesion, or hydronephrosis. Bladder is unremarkable.  Stomach/Bowel: Bowel shows no evidence of obstruction, ileus, inflammation or lesion. No free intraperitoneal air identified. Vascular/Lymphatic: Atherosclerosis of the abdominal aorta without evidence of aneurysm. No enlarged lymph nodes identified. Reproductive: Uterus and bilateral adnexa are unremarkable. Other: Right inguinal hernia mesh without evidence of hernia. No ascites or focal abscess identified. Musculoskeletal: Diffuse osteopenia. Mild compression deformity involving the superior endplate of L4 with approximately 30-40% maximal loss of vertebral body height. The L2 vertebral body shows minimal loss in height. Degenerative disc disease in the lumbar spine. Associated mild rightward convex scoliosis. IMPRESSION: 1. No acute findings in the abdomen or pelvis. 2. Benign cyst of the liver. 3. Splenic granulomata. 4. Right inguinal hernia mesh without evidence of recurrent hernia. 5. Diffuse osteopenia with mild compression deformity of the L4 vertebral body and minimal loss of height of L2. Electronically Signed   By: GAletta EdouardM.D.   On: 10/19/2020 15:08   CT L-SPINE NO CHARGE  Result Date: 10/19/2020 CLINICAL DATA:  Compression deformities at L4 and L2 EXAM: CT LUMBAR SPINE WITHOUT CONTRAST TECHNIQUE: Multidetector CT imaging of the lumbar spine was performed without intravenous contrast administration. Multiplanar CT image reconstructions were also generated. COMPARISON:  CT abdomen 10/19/2020 as well as lumbar spine radiographs from 08/07/2020 FINDINGS: Segmentation: The lowest lumbar type non-rib-bearing vertebra is labeled as L5. Alignment: 2 mm degenerative retrolisthesis at L2-3. Vertebrae: At T11 there is a chronic proximally 10% superior endplate compression fracture. At T12, there is a is a acute/early subacute 10% superior endplate compression fracture with visible subcortical fracture plane along the superior endplate containing a small amount of nitrogen gas phenomenon. 3 mm of  associated posterior retropulsion. Mild paraspinal edema. At L2 there is a 15-20% superior endplate compression fracture eccentric to the right, with sclerosis in the vertebral body and along the superior endplate suggesting subacute chronicity. There 2-3 mm of posterior bony retropulsion along the right posterosuperior endplate. L4, there is a 40% superior endplate compression fracture with 3 mm of chronic posterior bony retropulsion, age indeterminate but likely chronic or late subacute. I do not see substantial posterior column involvement in any  of these fractures. There is considerable dextroconvex lumbar scoliosis with rotary component. Paraspinal and other soft tissues: Please refer to the dedicated CT abdomen report by Dr. Kathlene Cote. Disc levels: L1-2: Mild left foraminal stenosis due to facet arthropathy and intervertebral spurring. L2-3: Mild left foraminal stenosis due to facet and intervertebral spurring. L3-4: Mild left foraminal stenosis and moderate central narrowing of the thecal sac due to congenitally short pedicles, chronic posterior bony retropulsion, and degenerative facet arthropathy. L4-5: Moderate right and mild left foraminal stenosis and potential central narrowing of the thecal sac due to disc bulge, facet arthropathy, and intervertebral spurring. L5-S1: Moderate right foraminal stenosis due to intervertebral spurring. IMPRESSION: 1. Acute or early subacute 10% superior endplate compression fracture at T12, with paraspinal edema. 2. Likely late subacute 15-20% superior endplate compression fracture at L2. 3. Likely late subacute or chronic 40% superior endplate compression fracture at L4. 4. Lumbar spondylosis, scoliosis, congenitally short pedicles, and degenerative disc disease contribute to at least moderate impingement at L3-4, L4-5, and L5-S1; and mild impingement at L1-2 and L2-3 as detailed above. Electronically Signed   By: Van Clines M.D.   On: 10/19/2020 15:50     ____________________________________________   PROCEDURES  Procedure(s) performed (including Critical Care):  Procedures   ____________________________________________   INITIAL IMPRESSION / ASSESSMENT AND PLAN / ED COURSE      Patient presents with above-stated history and exam for assessment of some lower abdominal pain rating on both flanks to the lower back.  Fortunately history is very limited from the patient secondary to her dementia and fairly advanced deafness.  Abdomen on exam is soft and nontender throughout and she has no CVA tenderness or overlying skin changes over her back.Mild lower T and L spine midline TTP.   Given her age and very limited history differential is quite broad and includes constipation, kidney stones, cystitis, pyelonephritis, diverticulitis, AAA, cholecystitis, pancreatitis and possible orthopedic injury from fall several weeks ago.  She has not had any new chest pain, cough, shortness of breath and has no new tachycardia tachypnea or hypoxia to suggest pneumonia, PE and she has no upper abdominal pain to suggest atypical presentation for ACS.  CBC obtained yesterday shows no acute leukocytosis or anemia.  Will defer repeat today.  BMP shows some mild hyponatremia with NA of 127 without any other acute electrolyte or metabolic derangements.  Do not believe this is related to patient's low back pain and given absence of any focal deficits with patient at her neurological baseline do not believe requires emergent correction.  This will need to be rechecked by her PCP in the next 2 or 3 days.  Lipase obtained today is not consistent pancreatitis.  Hepatic function panel has no evidence of hepatitis or cholestasis.  UA is not suggestive of cystitis.  CT abdomen pelvis shows no evidence of kidney stone, pyelonephritis, diverticulitis or other acute abdominal pelvic process.  There is a benign cyst in the liver and some granuloma in the spleen.  There is also  right inguinal hernia mesh that shows no acute complication.  There is also acute osteopenia noted and a mild compression deformity of L4 concerning for compression fracture given history of fall couple weeks ago.  CT L-spine reformatted shows an acute compression fracture at T12 with some paraspinal edema as well as L2 and L4 as well as spondylosis and scoliosis and some degenerative changes as well.  No other acute abnormalities or injuries noted.  Suspect these compression fractures are likely  the source patient symptoms.  She is already using appropriate analgesia with lidocaine patch and advised she may use 1 g Tylenol as well as needed.  She has no neurological deficits on lower extremity exam.  Given suppressed spinal edema noted I did discuss with on-call neurosurgeon Dr. Cari Caraway who recommended placing patient in an LSO brace and having patient follow-up in neurosurgery clinic.  LSO brace ordered.  Clinic information provided and discussed with patient and friend at bedside importance of close outpatient neurosurgery follow-up.  They are amenable to plan.  Discharged stable condition.  Strict return precautions advised and discussed.  Instruction provided in writing for staff to have her sodium rechecked in 2 or 3 days.     ____________________________________________   FINAL CLINICAL IMPRESSION(S) / ED DIAGNOSES  Final diagnoses:  Lower back pain  Acute bilateral low back pain without sciatica  Hyponatremia  Compression fracture of L4 vertebra, initial encounter (HCC)  Compression fracture of L2 vertebra, initial encounter (HCC)  Compression fracture of T12 vertebra, initial encounter (HCC)    Medications  iohexol (OMNIPAQUE) 350 MG/ML injection 75 mL (75 mLs Intravenous Contrast Given 10/19/20 1449)  acetaminophen (TYLENOL) tablet 1,000 mg (1,000 mg Oral Given 10/19/20 1541)     ED Discharge Orders     None        Note:  This document was prepared using Dragon voice  recognition software and may include unintentional dictation errors.    Lucrezia Starch, MD 10/19/20 1608    Lucrezia Starch, MD 10/19/20 2022

## 2020-10-19 NOTE — Progress Notes (Signed)
Orthopedic Tech Progress Note Patient Details:  Bridget Nolan March 14, 1921 [id number] Called order into [facility] Patient ID: Bridget Nolan, female   DOB: Dec 13, 1921, 87 y.o.   MRN: [id number]  Chip Boer 10/19/2020, 4:31 PM

## 2020-10-23 ENCOUNTER — Encounter: Payer: Self-pay | Admitting: Internal Medicine

## 2020-10-23 ENCOUNTER — Non-Acute Institutional Stay: Payer: Medicare Other | Admitting: Internal Medicine

## 2020-10-23 VITALS — BP 137/85 | HR 81 | Temp 97.1°F | Resp 17 | Wt 140.0 lb

## 2020-10-23 DIAGNOSIS — N1832 Chronic kidney disease, stage 3b: Secondary | ICD-10-CM | POA: Diagnosis not present

## 2020-10-23 DIAGNOSIS — S32040D Wedge compression fracture of fourth lumbar vertebra, subsequent encounter for fracture with routine healing: Secondary | ICD-10-CM

## 2020-10-23 DIAGNOSIS — M810 Age-related osteoporosis without current pathological fracture: Secondary | ICD-10-CM

## 2020-10-23 DIAGNOSIS — K5901 Slow transit constipation: Secondary | ICD-10-CM

## 2020-10-23 DIAGNOSIS — E871 Hypo-osmolality and hyponatremia: Secondary | ICD-10-CM

## 2020-10-23 DIAGNOSIS — S32020D Wedge compression fracture of second lumbar vertebra, subsequent encounter for fracture with routine healing: Secondary | ICD-10-CM

## 2020-10-23 DIAGNOSIS — S22080D Wedge compression fracture of T11-T12 vertebra, subsequent encounter for fracture with routine healing: Secondary | ICD-10-CM | POA: Diagnosis not present

## 2020-10-23 DIAGNOSIS — F0150 Vascular dementia without behavioral disturbance: Secondary | ICD-10-CM | POA: Diagnosis not present

## 2020-10-23 NOTE — Progress Notes (Signed)
Subjective:    Patient ID: Bridget Nolan, female    DOB: 1921/09/11, 87 y.o.   MRN: [id number]  HPI  Asked to see resident in [address] for ER follow up. She went to the ER 9/17 with c/o bilateral flank pain and abdominal pain. Per report, she had a fall 3-4 weeks prior. Labs showed mild hyponatremia. CT abdomen pelvis did not show any acute findings. CT lumbar spine did show T12, L2 and L4 compression fracture. Neurosurgery was consulted. She was advised to wear a LSO brace and follow up with neurosurgery as an outpatient. She has been taking Norco for pain.  Resident reports she sleeps well Her appetite is good, weight is up slightly She can walk with a rolling walker She is starting to need some assistance with ADL's She denies any urinary concerns She is constipated at times She denies chest pain or SOB although CNA reports she has to sleep up on a few pillows because it seems like she is wheezing She reports her mood is good.  Vascular Dementia: Mild cognitive and functional needs. She is not taking any medications for this.  CKD 3: Recent creatinine 1.0, GFR 51. She is not on an ACEI/ARB.  Osteoporosis: She is taking Vit D and getting weight bearing in daily.  Constipation: She takes Mirilax daily with good results  Review of Systems     Past Medical History:  Diagnosis Date   Cancer ([place]) skin   Cataract    Hearing loss    Osteoporosis    Vascular dementia ([place])     Current Outpatient Medications  Medication Sig Dispense Refill   acetaminophen (TYLENOL) 650 MG CR tablet Take 650 mg by mouth at bedtime.     cholecalciferol (VITAMIN D3) 25 MCG (1000 UNIT) tablet Take 1,000 Units by mouth daily.     cyanocobalamin 1000 MCG tablet Take 1,000 mcg by mouth daily.     HYDROcodone-acetaminophen (NORCO/VICODIN) 5-325 MG tablet Take 1 tablet by mouth every 8 (eight) hours as needed for moderate pain.     polyethylene glycol (MIRALAX / GLYCOLAX) 17 g packet Take 17 g by mouth  daily.     No current facility-administered medications for this visit.    Allergies  Allergen Reactions   Simvastatin     Other reaction(s): Muscle Pain   Levofloxacin Rash   Penicillins Rash    Has patient had a PCN reaction causing immediate rash, facial/tongue/throat swelling, SOB or lightheadedness with hypotension: Yes Has patient had a PCN reaction causing severe rash involving mucus membranes or skin necrosis: No Has patient had a PCN reaction that required hospitalization Yes Has patient had a PCN reaction occurring within the last 10 years: No If all of the above answers are "NO", then may proceed with Cephalosporin use.    Family History  Problem Relation Age of Onset   Thyroid cancer Mother    Kidney disease Brother    Diabetes Neg Hx     Social History   Socioeconomic History   Marital status: Widowed    Spouse name: Not on file   Number of children: 2   Years of education: Not on file   Highest education level: Not on file  Occupational History   Occupation: Housewife  Tobacco Use   Smoking status: Former    Types: Cigarettes    Quit date: 02/1943    Years since quitting: 77.7   Smokeless tobacco: Never   Tobacco comments:    Social Smoker  Substance and Sexual Activity   Alcohol use: Yes    Comment: occ   Drug use: Never   Sexual activity: Not on file  Other Topics Concern   Not on file  Social History Narrative   Widowed ~2008   2 daughters   Has living will   Grandson Allyne Gee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   Respiratory: Denies difficulty breathing, shortness of breath, cough or sputum production.   Cardiovascular: Denies chest pain, chest tightness, palpitations or swelling in the hands or feet.  Gastrointestinal: Pt reports intermittent constipation. Denies abdominal pain, bloating, diarrhea or blood in the stool.  GU: Denies urgency, frequency, pain with urination, burning sensation, blood in urine, odor or discharge. Musculoskeletal: Pt reports low back pain. Denies decrease in range of motion, difficulty with gait, muscle pain or joint swelling.  Skin: Denies redness, rashes, lesions or ulcercations.  Neurological: Pt reports difficulty with memory. Denies dizziness, difficulty with speech or problems with balance and coordination.  Psych: Denies anxiety, depression, SI/HI.  No other specific complaints in a complete review of systems (except as listed in HPI above).   Objective:   Physical Exam BP 137/85   Pulse 81   Temp (!) 97.1 F (36.2 C)   Resp 17   Wt 140 lb (63.5 kg)   BMI 24.80 kg/m  Wt Readings from Last 3 Encounters:  10/23/20 140 lb (63.5 kg)  10/19/20 127 lb (57.6 kg)  10/18/20 144 lb (65.3 kg)    General: Appears her stated age, well developed, well nourished in NAD. Skin: Warm, dry and intact. Bruising noted to right forearm. Cardiovascular: Normal rate and rhythm. S1,S2 noted.  No murmur, rubs or gallops noted.  Pulmonary/Chest: Normal effort and positive vesicular breath sounds. No respiratory distress. No wheezes, rales or ronchi noted.  Abdomen: Soft and nontender.  Musculoskeletal: Unable to evaluate as she has the LSO brace on at this time. In wheelchair. Neurological: Alert, slightly confused.   BMET    Component Value Date/Time   NA 127 (L) 10/18/2020 1553   NA 140 04/24/2019 0000   K 4.2 10/18/2020 1553   CL 96 (L) 10/18/2020 1553   CO2 21 (L) 10/18/2020 1553   GLUCOSE 128 (H) 10/18/2020 1553   BUN 23 10/18/2020 1553   BUN 23 (A) 04/24/2019  0000   CREATININE 1.00 10/18/2020 1553   CALCIUM 9.3 10/18/2020 1553   GFRNONAA 51 (L) 10/18/2020 1553   GFRAA 44 04/24/2019 0000    Lipid Panel  No results found for: CHOL, TRIG, HDL, CHOLHDL, VLDL, LDLCALC  CBC    Component Value Date/Time   WBC 6.7 10/18/2020 1553   RBC 4.04 10/18/2020 1553   HGB 13.6 10/18/2020 1553   HCT 38.3 10/18/2020 1553   PLT 211 10/18/2020 1553   MCV 94.8 10/18/2020 1553   MCH 33.7 10/18/2020 1553   MCHC 35.5 10/18/2020 1553   RDW 12.2 10/18/2020 1553   LYMPHSABS 0.5 (L) 02/01/2015 2208   MONOABS 0.5 02/01/2015 2208   EOSABS 0.1 02/01/2015 2208   BASOSABS 0.0 02/01/2015 2208  Hgb A1C Lab Results  Component Value Date   HGBA1C 5.5 02/03/2015             Assessment & Plan:   ER Follow Up for T12 Compression Fracture, L2 and L4 Compression Fracture, Hyponatremia:  ER notes, labs and imaging reviewed Repeat BMET tomorrow Continue LSO brace until follow up with neurosurgery PT eval at ALF Continue Hydrocodone as needed for pain  Will reassess as needed Webb Silversmith, NP This visit occurred during the SARS-CoV-2 public health emergency.  Safety protocols were in place, including screening questions prior to the visit, additional usage of staff PPE, and extensive cleaning of exam room while observing appropriate contact time as indicated for disinfecting solutions.

## 2020-10-23 NOTE — Assessment & Plan Note (Signed)
Continue Mirilax

## 2020-10-23 NOTE — Assessment & Plan Note (Signed)
Will check BMET tomorrow

## 2020-10-23 NOTE — Assessment & Plan Note (Signed)
Increasing care needs Ok to stay at ALF now, will see how she progresses over the next few months Appreciate ALF care

## 2020-10-23 NOTE — Patient Instructions (Signed)
Spinal Compression Fracture A spinal compression fracture is a collapse of the bones that form the spine (vertebrae). With this type of fracture, the vertebrae become pushed (compressed) into a wedge shape. Most compression fractures happen in the middle or lower part of the spine. What are the causes? This condition may be caused by: Thinning and loss of density in the bones (osteoporosis). This is the most common cause. A fall. A car or motorcycle accident. Cancer. Trauma, such as a heavy, direct hit to the head or back. What increases the risk? You are more likely to develop this condition if: You are 60 years of age or older. You have osteoporosis. You have certain types of cancer, including: Multiple myeloma. Lymphoma. Prostate cancer. Lung cancer. Breast cancer. What are the signs or symptoms? Symptoms of this condition include: Severe pain with simple movements such as coughing or sneezing. Pain that gets worse over time. Pain that is worse when you stand, walk, sit, or bend. Sudden pain that is so bad that it is hard for you to move. Bending or humping of the spine. Gradual loss of height. Numbness, tingling, or weakness in the back and legs. Trouble walking. Your symptoms will depend on the cause of the fracture and how quickly it develops. How is this diagnosed? This condition may be diagnosed based on symptoms, medical history, and a physical exam. During the physical exam, your health care provider may tap along the length of your spine to check for tenderness. Tests may be done to confirm the diagnosis. They may include: A bone mineral density test to check for osteoporosis. Imaging tests, such as a spine X-ray, CT scan, or MRI. How is this treated? Treatment depends on the cause and severity of the condition. Some fractures may heal on their own with supportive care. Treatment may include: Pain medicine. Rest. A back brace. Physical therapy exercises. Medicine to  strengthen bone. Calcium and vitamin D supplements. Fractures that cause the back to become misshapen, cause nerve pain or weakness, or do not respond to other treatment may be treated with surgery. This may include: Vertebroplasty. Bone cement is injected into the collapsed vertebrae to stabilize them. Balloon kyphoplasty. The collapsed vertebrae are expanded with a balloon and then bone cement is injected into them. Spinal fusion. The collapsed vertebrae are connected (fused) to normal vertebrae. Follow these instructions at home: Medicines Take over-the-counter and prescription medicines only as told by your health care provider. Ask your health care provider if the medicine prescribed to you: Requires you to avoid driving or using machinery. Can cause constipation. You may need to take these actions to prevent or treat constipation: Drink enough fluid to keep your urine pale yellow. Take over-the-counter or prescription medicines. Eat foods that are high in fiber, such as beans, whole grains, and fresh fruits and vegetables. Limit foods that are high in fat and processed sugars, such as fried or sweet foods. If you have a brace: Wear the brace as told by your health care provider. Remove it only as told by your health care provider. Loosen the brace if your fingers or toes tingle, become numb, or turn cold and blue. Keep the brace clean. If the brace is not waterproof: Do not let it get wet. Cover it with a watertight covering when you take a bath or a shower. Managing pain, stiffness, and swelling  If directed, put ice on the injured area. To do this: If you have a removable brace, remove it as told   by your health care provider. Put ice in a plastic bag. Place a towel between your skin and the bag. Leave the ice on for 20 minutes, 2-3 times a day. Remove the ice if your skin turns bright red. This is very important. If you cannot feel pain, heat, or cold, you have a greater risk of  damage to the area. Activity Rest as told by your health care provider. Avoid sitting for a long time without moving. Get up to take short walks every 1-2 hours. This is important to improve blood flow and breathing. Ask for help if you feel weak or unsteady. Return to your normal activities as told by your health care provider. Ask what activities are safe for you. Do physical therapy exercises to improve movement and strength in your back, as recommended by your health care provider. Exercise regularly as directed by your health care provider. General instructions  Do not drink alcohol. Alcohol can interfere with your treatment. Do not use any products that contain nicotine or tobacco, such as cigarettes, e-cigarettes, and chewing tobacco. These can delay bone healing. If you need help quitting, ask your health care provider. Keep all follow-up visits. This is important. It can help to prevent permanent injury, disability, and long-lasting (chronic) pain. Contact a health care provider if: You have a fever. Your pain medicine is not helping. Your pain does not get better over time. You cannot return to your normal activities as planned or expected. Get help right away if: Your pain is very bad and it suddenly gets worse. You are unable to move any body part (paralysis) that is below the level of your injury. You have numbness, tingling, or weakness in any body part that is below the level of your injury. You cannot control your bladder or bowels. Summary A spinal compression fracture is a collapse of the bones that form the spine (vertebrae). With this type of fracture, the vertebrae become pushed (compressed) into a wedge shape. Your symptoms and treatment will depend on the cause and severity of the fracture and how quickly it develops. Some fractures may heal on their own with supportive care. Fractures that cause the back to become misshapen, cause nerve pain or weakness, or do not  respond to other treatment may be treated with surgery. This information is not intended to replace advice given to you by your health care provider. Make sure you discuss any questions you have with your health care provider. Document Revised: 05/10/2019 Document Reviewed: 05/10/2019 [employer] Patient Education  2022 [employer]  

## 2020-10-23 NOTE — Assessment & Plan Note (Signed)
Continue Vit D and daily weightbearing

## 2021-01-24 ENCOUNTER — Non-Acute Institutional Stay: Payer: Medicare Other | Admitting: Internal Medicine

## 2021-01-24 ENCOUNTER — Encounter: Payer: Self-pay | Admitting: Internal Medicine

## 2021-01-24 VITALS — BP 120/61 | HR 85 | Temp 97.4°F | Resp 18 | Wt 145.0 lb

## 2021-01-24 DIAGNOSIS — M810 Age-related osteoporosis without current pathological fracture: Secondary | ICD-10-CM | POA: Diagnosis not present

## 2021-01-24 DIAGNOSIS — F01A0 Vascular dementia, mild, without behavioral disturbance, psychotic disturbance, mood disturbance, and anxiety: Secondary | ICD-10-CM | POA: Diagnosis not present

## 2021-01-24 DIAGNOSIS — U071 COVID-19: Secondary | ICD-10-CM | POA: Diagnosis not present

## 2021-01-24 DIAGNOSIS — M545 Low back pain, unspecified: Secondary | ICD-10-CM

## 2021-01-24 DIAGNOSIS — N1832 Chronic kidney disease, stage 3b: Secondary | ICD-10-CM | POA: Diagnosis not present

## 2021-01-24 DIAGNOSIS — K5901 Slow transit constipation: Secondary | ICD-10-CM

## 2021-01-24 NOTE — Assessment & Plan Note (Signed)
Encourage daily weightbearing Continue Vit D

## 2021-01-24 NOTE — Patient Instructions (Signed)
COVID-19: Quarantine and Isolation °Quarantine °If you were exposed °Quarantine and stay away from others when you have been in close contact with someone who has COVID-19. °Isolate °If you are sick or test positive °Isolate when you are sick or when you have COVID-19, even if you don't have symptoms. °When to stay home °Calculating quarantine °The date of your exposure is considered day 0. Day 1 is the first full day after your last contact with a person who has had COVID-19. Stay home and away from other people for at least 5 days. Learn why CDC updated guidance for the general public. °IF YOU were exposed to COVID-19 and are NOT  °up to dateIF YOU were exposed to COVID-19 and are NOT on COVID-19 vaccinations °Quarantine for at least 5 days °Stay home °Stay home and quarantine for at least 5 full days. °Wear a well-fitting mask if you must be around others in your home. °Do not travel. °Get tested °Even if you don't develop symptoms, get tested at least 5 days after you last had close contact with someone with COVID-19. °After quarantine °Watch for symptoms °Watch for symptoms until 10 days after you last had close contact with someone with COVID-19. °Avoid travel °It is best to avoid travel until a full 10 days after you last had close contact with someone with COVID-19. °If you develop symptoms °Isolate immediately and get tested. Continue to stay home until you know the results. Wear a well-fitting mask around others. °Take precautions until day 10 °Wear a well-fitting mask °Wear a well-fitting mask for 10 full days any time you are around others inside your home or in public. Do not go to places where you are unable to wear a well-fitting mask. °If you must travel during days 6-10, take precautions. °Avoid being around people who are more likely to get very sick from COVID-19. °IF YOU were exposed to COVID-19 and are  °up to dateIF YOU were exposed to COVID-19 and are on COVID-19 vaccinations °No  quarantine °You do not need to stay home unless you develop symptoms. °Get tested °Even if you don't develop symptoms, get tested at least 5 days after you last had close contact with someone with COVID-19. °Watch for symptoms °Watch for symptoms until 10 days after you last had close contact with someone with COVID-19. °If you develop symptoms °Isolate immediately and get tested. Continue to stay home until you know the results. Wear a well-fitting mask around others. °Take precautions until day 10 °Wear a well-fitting mask °Wear a well-fitting mask for 10 full days any time you are around others inside your home or in public. Do not go to places where you are unable to wear a well-fitting mask. °Take precautions if traveling °Avoid being around people who are more likely to get very sick from COVID-19. °IF YOU were exposed to COVID-19 and had confirmed COVID-19 within the past 90 days (you tested positive using a viral test) °No quarantine °You do not need to stay home unless you develop symptoms. °Watch for symptoms °Watch for symptoms until 10 days after you last had close contact with someone with COVID-19. °If you develop symptoms °Isolate immediately and get tested. Continue to stay home until you know the results. Wear a well-fitting mask around others. °Take precautions until day 10 °Wear a well-fitting mask °Wear a well-fitting mask for 10 full days any time you are around others inside your home or in public. Do not go to places where you are   unable to wear a well-fitting mask. °Take precautions if traveling °Avoid being around people who are more likely to get very sick from COVID-19. °Calculating isolation °Day 0 is your first day of symptoms or a positive viral test. Day 1 is the first full day after your symptoms developed or your test specimen was collected. If you have COVID-19 or have symptoms, isolate for at least 5 days. °IF YOU tested positive for COVID-19 or have symptoms, regardless of  vaccination status °Stay home for at least 5 days °Stay home for 5 days and isolate from others in your home. °Wear a well-fitting mask if you must be around others in your home. °Do not travel. °Ending isolation if you had symptoms °End isolation after 5 full days if you are fever-free for 24 hours (without the use of fever-reducing medication) and your symptoms are improving. °Ending isolation if you did NOT have symptoms °End isolation after at least 5 full days after your positive test. °If you got very sick from COVID-19 or have a weakened immune system °You should isolate for at least 10 days. Consult your doctor before ending isolation. °Take precautions until day 10 °Wear a well-fitting mask °Wear a well-fitting mask for 10 full days any time you are around others inside your home or in public. Do not go to places where you are unable to wear a well-fitting mask. °Do not travel °Do not travel until a full 10 days after your symptoms started or the date your positive test was taken if you had no symptoms. °Avoid being around people who are more likely to get very sick from COVID-19. °Definitions °Exposure °Contact with someone infected with SARS-CoV-2, the virus that causes COVID-19, in a way that increases the likelihood of getting infected with the virus. °Close contact °A close contact is someone who was less than 6 feet away from an infected person (laboratory-confirmed or a clinical diagnosis) for a cumulative total of 15 minutes or more over a 24-hour period. For example, three individual 5-minute exposures for a total of 15 minutes. People who are exposed to someone with COVID-19 after they completed at least 5 days of isolation are not considered close contacts. °Quarantine °Quarantine is a strategy used to prevent transmission of COVID-19 by keeping people who have been in close contact with someone with COVID-19 apart from others. °Who does not need to quarantine? °If you had close contact with  someone with COVID-19 and you are in one of the following groups, you do not need to quarantine. °You are up to date with your COVID-19 vaccines. °You had confirmed COVID-19 within the last 90 days (meaning you tested positive using a viral test). °If you are up to date with COVID-19 vaccines, you should wear a well-fitting mask around others for 10 days from the date of your last close contact with someone with COVID-19 (the date of last close contact is considered day 0). Get tested at least 5 days after you last had close contact with someone with COVID-19. If you test positive or develop COVID-19 symptoms, isolate from other people and follow recommendations in the Isolation section below. If you tested positive for COVID-19 with a viral test within the previous 90 days and subsequently recovered and remain without COVID-19 symptoms, you do not need to quarantine or get tested after close contact. You should wear a well-fitting mask around others for 10 days from the date of your last close contact with someone with COVID-19 (the date of last   close contact is considered day 0). If you have COVID-19 symptoms, get tested and isolate from other people and follow recommendations in the Isolation section below. °Who should quarantine? °If you come into close contact with someone with COVID-19, you should quarantine if you are not up to date on COVID-19 vaccines. This includes people who are not vaccinated. °What to do for quarantine °Stay home and away from other people for at least 5 days (day 0 through day 5) after your last contact with a person who has COVID-19. The date of your exposure is considered day 0. Wear a well-fitting mask when around others at home, if possible. °For 10 days after your last close contact with someone with COVID-19, watch for fever (100.4°F or greater), cough, shortness of breath, or other COVID-19 symptoms. °If you develop symptoms, get tested immediately and isolate until you receive  your test results. If you test positive, follow isolation recommendations. °If you do not develop symptoms, get tested at least 5 days after you last had close contact with someone with COVID-19. °If you test negative, you can leave your home, but continue to wear a well-fitting mask when around others at home and in public until 10 days after your last close contact with someone with COVID-19. °If you test positive, you should isolate for at least 5 days from the date of your positive test (if you do not have symptoms). If you do develop COVID-19 symptoms, isolate for at least 5 days from the date your symptoms began (the date the symptoms started is day 0). Follow recommendations in the isolation section below. °If you are unable to get a test 5 days after last close contact with someone with COVID-19, you can leave your home after day 5 if you have been without COVID-19 symptoms throughout the 5-day period. Wear a well-fitting mask for 10 days after your date of last close contact when around others at home and in public. °Avoid people who are have weakened immune systems or are more likely to get very sick from COVID-19, and nursing homes and other high-risk settings, until after at least 10 days. °If possible, stay away from people you live with, especially people who are at higher risk for getting very sick from COVID-19, as well as others outside your home throughout the full 10 days after your last close contact with someone with COVID-19. °If you are unable to quarantine, you should wear a well-fitting mask for 10 days when around others at home and in public. °If you are unable to wear a mask when around others, you should continue to quarantine for 10 days. Avoid people who have weakened immune systems or are more likely to get very sick from COVID-19, and nursing homes and other high-risk settings, until after at least 10 days. °See additional information about travel. °Do not go to places where you are  unable to wear a mask, such as restaurants and some gyms, and avoid eating around others at home and at work until after 10 days after your last close contact with someone with COVID-19. °After quarantine °Watch for symptoms until 10 days after your last close contact with someone with COVID-19. °If you have symptoms, isolate immediately and get tested. °Quarantine in high-risk congregate settings °In certain congregate settings that have high risk of secondary transmission (such as correctional and detention facilities, homeless shelters, or cruise ships), CDC recommends a 10-day quarantine for residents, regardless of vaccination and booster status. During periods of critical staffing   shortages, facilities may consider shortening the quarantine period for staff to ensure continuity of operations. Decisions to shorten quarantine in these settings should be made in consultation with state, local, tribal, or territorial health departments and should take into consideration the context and characteristics of the facility. CDC's setting-specific guidance provides additional recommendations for these settings. °Isolation °Isolation is used to separate people with confirmed or suspected COVID-19 from those without COVID-19. People who are in isolation should stay home until it's safe for them to be around others. At home, anyone sick or infected should separate from others, or wear a well-fitting mask when they need to be around others. People in isolation should stay in a specific "sick room" or area and use a separate bathroom if available. Everyone who has presumed or confirmed COVID-19 should stay home and isolate from other people for at least 5 full days (day 0 is the first day of symptoms or the date of the day of the positive viral test for asymptomatic persons). They should wear a mask when around others at home and in public for an additional 5 days. People who are confirmed to have COVID-19 or are showing  symptoms of COVID-19 need to isolate regardless of their vaccination status. This includes: °People who have a positive viral test for COVID-19, regardless of whether or not they have symptoms. °People with symptoms of COVID-19, including people who are awaiting test results or have not been tested. People with symptoms should isolate even if they do not know if they have been in close contact with someone with COVID-19. °What to do for isolation °Monitor your symptoms. If you have an emergency warning sign (including trouble breathing), seek emergency medical care immediately. °Stay in a separate room from other household members, if possible. °Use a separate bathroom, if possible. °Take steps to improve ventilation at home, if possible. °Avoid contact with other members of the household and pets. °Don't share personal household items, like cups, towels, and utensils. °Wear a well-fitting mask when you need to be around other people. °Learn more about what to do if you are sick and how to notify your contacts. °Ending isolation for people who had COVID-19 and had symptoms °If you had COVID-19 and had symptoms, isolate for at least 5 days. To calculate your 5-day isolation period, day 0 is your first day of symptoms. Day 1 is the first full day after your symptoms developed. You can leave isolation after 5 full days. °You can end isolation after 5 full days if you are fever-free for 24 hours without the use of fever-reducing medication and your other symptoms have improved (Loss of taste and smell may persist for weeks or months after recovery and need not delay the end of isolation). °You should continue to wear a well-fitting mask around others at home and in public for 5 additional days (day 6 through day 10) after the end of your 5-day isolation period. If you are unable to wear a mask when around others, you should continue to isolate for a full 10 days. Avoid people who have weakened immune systems or are more  likely to get very sick from COVID-19, and nursing homes and other high-risk settings, until after at least 10 days. °If you continue to have fever or your other symptoms have not improved after 5 days of isolation, you should wait to end your isolation until you are fever-free for 24 hours without the use of fever-reducing medication and your other symptoms have improved.   Continue to wear a well-fitting mask through day 10. Contact your healthcare provider if you have questions. °See additional information about travel. °Do not go to places where you are unable to wear a mask, such as restaurants and some gyms, and avoid eating around others at home and at work until a full 10 days after your first day of symptoms. °If an individual has access to a test and wants to test, the best approach is to use an antigen test1 towards the end of the 5-day isolation period. Collect the test sample only if you are fever-free for 24 hours without the use of fever-reducing medication and your other symptoms have improved (loss of taste and smell may persist for weeks or months after recovery and need not delay the end of isolation). If your test result is positive, you should continue to isolate until day 10. If your test result is negative, you can end isolation, but continue to wear a well-fitting mask around others at home and in public until day 10. Follow additional recommendations for masking and avoiding travel as described above. °1As noted in the labeling for authorized over-the counter antigen tests: Negative results should be treated as presumptive. Negative results do not rule out SARS-CoV-2 infection and should not be used as the sole basis for treatment or patient management decisions, including infection control decisions. To improve results, antigen tests should be used twice over a three-day period with at least 24 hours and no more than 48 hours between tests. °Note that these recommendations on ending isolation  do not apply to people who are moderately ill or very sick from COVID-19 or have weakened immune systems. See section below for recommendations for when to end isolation for these groups. °Ending isolation for people who tested positive for COVID-19 but had no symptoms °If you test positive for COVID-19 and never develop symptoms, isolate for at least 5 days. Day 0 is the day of your positive viral test (based on the date you were tested) and day 1 is the first full day after the specimen was collected for your positive test. You can leave isolation after 5 full days. °If you continue to have no symptoms, you can end isolation after at least 5 days. °You should continue to wear a well-fitting mask around others at home and in public until day 10 (day 6 through day 10). If you are unable to wear a mask when around others, you should continue to isolate for 10 days. Avoid people who have weakened immune systems or are more likely to get very sick from COVID-19, and nursing homes and other high-risk settings, until after at least 10 days. °If you develop symptoms after testing positive, your 5-day isolation period should start over. Day 0 is your first day of symptoms. Follow the recommendations above for ending isolation for people who had COVID-19 and had symptoms. °See additional information about travel. °Do not go to places where you are unable to wear a mask, such as restaurants and some gyms, and avoid eating around others at home and at work until 10 days after the day of your positive test. °If an individual has access to a test and wants to test, the best approach is to use an antigen test1 towards the end of the 5-day isolation period. If your test result is positive, you should continue to isolate until day 10. If your test result is positive, you can also choose to test daily and if your test result   is negative, you can end isolation, but continue to wear a well-fitting mask around others at home and in  public until day 10. Follow additional recommendations for masking and avoiding travel as described above. °1As noted in the labeling for authorized over-the counter antigen tests: Negative results should be treated as presumptive. Negative results do not rule out SARS-CoV-2 infection and should not be used as the sole basis for treatment or patient management decisions, including infection control decisions. To improve results, antigen tests should be used twice over a three-day period with at least 24 hours and no more than 48 hours between tests. °Ending isolation for people who were moderately or very sick from COVID-19 or have a weakened immune system °People who are moderately ill from COVID-19 (experiencing symptoms that affect the lungs like shortness of breath or difficulty breathing) should isolate for 10 days and follow all other isolation precautions. To calculate your 10-day isolation period, day 0 is your first day of symptoms. Day 1 is the first full day after your symptoms developed. If you are unsure if your symptoms are moderate, talk to a healthcare provider for further guidance. °People who are very sick from COVID-19 (this means people who were hospitalized or required intensive care or ventilation support) and people who have weakened immune systems might need to isolate at home longer. They may also require testing with a viral test to determine when they can be around others. CDC recommends an isolation period of at least 10 and up to 20 days for people who were very sick from COVID-19 and for people with weakened immune systems. Consult with your healthcare provider about when you can resume being around other people. If you are unsure if your symptoms are severe or if you have a weakened immune system, talk to a healthcare provider for further guidance. °People who have a weakened immune system should talk to their healthcare provider about the potential for reduced immune responses to  COVID-19 vaccines and the need to continue to follow current prevention measures (including wearing a well-fitting mask and avoiding crowds and poorly ventilated indoor spaces) to protect themselves against COVID-19 until advised otherwise by their healthcare provider. Close contacts of immunocompromised people--including household members--should also be encouraged to receive all recommended COVID-19 vaccine doses to help protect these people. °Isolation in high-risk congregate settings °In certain high-risk congregate settings that have high risk of secondary transmission and where it is not feasible to cohort people (such as correctional and detention facilities, homeless shelters, and cruise ships), CDC recommends a 10-day isolation period for residents. During periods of critical staffing shortages, facilities may consider shortening the isolation period for staff to ensure continuity of operations. Decisions to shorten isolation in these settings should be made in consultation with state, local, tribal, or territorial health departments and should take into consideration the context and characteristics of the facility. CDC's setting-specific guidance provides additional recommendations for these settings. °This CDC guidance is meant to supplement--not replace--any federal, state, local, territorial, or tribal health and safety laws, rules, and regulations. °Recommendations for specific settings °These recommendations do not apply to healthcare professionals. For guidance specific to these settings, see °Healthcare professionals: Interim Guidance for Managing Healthcare Personnel with SARS-CoV-2 Infection or Exposure to SARS-CoV-2 °Patients, residents, and visitors to healthcare settings: Interim Infection Prevention and Control Recommendations for Healthcare Personnel During the Coronavirus Disease 2019 (COVID-19) Pandemic °Additional setting-specific guidance and recommendations are available. °These  recommendations on quarantine and isolation do apply to K[employer]12 School   settings. Additional guidance is available here: Overview of COVID-19 Quarantine for K-12 Schools °Travelers: Travel information and recommendations °Congregate facilities and other settings: guidance pages for community, work, and school settings °Ongoing COVID-19 exposure FAQs °I live with someone with COVID-19, but I cannot be separated from them. How do we manage quarantine in this situation? °It is very important for people with COVID-19 to remain apart from other people, if possible, even if they are living together. If separation of the person with COVID-19 from others that they live with is not possible, the other people that they live with will have ongoing exposure, meaning they will be repeatedly exposed until that person is no longer able to spread the virus to other people. In this situation, there are precautions you can take to limit the spread of COVID-19: °The person with COVID-19 and everyone they live with should wear a well-fitting mask inside the home. °If possible, one person should care for the person with COVID-19 to limit the number of people who are in close contact with the infected person. °Take steps to protect yourself and others to reduce transmission in the home: °Quarantine if you are not up to date with your COVID-19 vaccines. °Isolate if you are sick or tested positive for COVID-19, even if you don't have symptoms. °Learn more about the public health recommendations for testing, mask use and quarantine of close contacts, like yourself, who have ongoing exposure. These recommendations differ depending on your vaccination status. °What should I do if I have ongoing exposure to COVID-19 from someone I live with? °Recommendations for this situation depend on your vaccination status: °If you are not up to date on COVID-19 vaccines and have ongoing exposure to COVID-19, you should: °Begin quarantine immediately and  continue to quarantine throughout the isolation period of the person with COVID-19. °Continue to quarantine for an additional 5 days starting the day after the end of isolation for the person with COVID-19. °Get tested at least 5 days after the end of isolation of the infected person that lives with them. °If you test negative, you can leave the home but should continue to wear a well-fitting mask when around others at home and in public until 10 days after the end of isolation for the person with COVID-19. °Isolate immediately if you develop symptoms of COVID-19 or test positive. °If you are up to date with COVID-19 vaccines and have ongoing exposure to COVID-19, you should: °Get tested at least 5 days after your first exposure. A person with COVID-19 is considered infectious starting 2 days before they develop symptoms, or 2 days before the date of their positive test if they do not have symptoms. °Get tested again at least 5 days after the end of isolation for the person with COVID-19. °Wear a well-fitting mask when you are around the person with COVID-19, and do this throughout their isolation period. °Wear a well-fitting mask around others for 10 days after the infected person's isolation period ends. °Isolate immediately if you develop symptoms of COVID-19 or test positive. °What should I do if multiple people I live with test positive for COVID-19 at different times? °Recommendations for this situation depend on your vaccination status: °If you are not up to date with your COVID-19 vaccines, you should: °Quarantine throughout the isolation period of any infected person that you live with. °Continue to quarantine until 5 days after the end of isolation date for the most recently infected person that lives with you. For example, if   the last day of isolation of the person most recently infected with COVID-19 was June 30, the new 5-day quarantine period starts on July 1. °Get tested at least 5 days after the end  of isolation for the most recently infected person that lives with you. °Wear a well-fitting mask when you are around any person with COVID-19 while that person is in isolation. °Wear a well-fitting mask when you are around other people until 10 days after your last close contact. °Isolate immediately if you develop symptoms of COVID-19 or test positive. °If you are up to date with your COVID-19 vaccines, you should: °Get tested at least 5 days after your first exposure. A person with COVID-19 is considered infectious starting 2 days before they developed symptoms, or 2 days before the date of their positive test if they do not have symptoms. °Get tested again at least 5 days after the end of isolation for the most recently infected person that lives with you. °Wear a well-fitting mask when you are around any person with COVID-19 while that person is in isolation. °Wear a well-fitting mask around others for 10 days after the end of isolation for the most recently infected person that lives with you. For example, if the last day of isolation for the person most recently infected with COVID-19 was June 30, the new 10-day period to wear a well-fitting mask indoors in public starts on July 1. °Isolate immediately if you develop symptoms of COVID-19 or test positive. °I had COVID-19 and completed isolation. Do I have to quarantine or get tested if someone I live with gets COVID-19 shortly after I completed isolation? °No. If you recently completed isolation and someone that lives with you tests positive for the virus that causes COVID-19 shortly after the end of your isolation period, you do not have to quarantine or get tested as long as you do not develop new symptoms. Once all of the people that live together have completed isolation or quarantine, refer to the guidance below for new exposures to COVID-19. °If you had COVID-19 in the previous 90 days and then came into close contact with someone with COVID-19, you do  not have to quarantine or get tested if you do not have symptoms. But you should: °Wear a well-fitting mask indoors in public for 10 days after your last close contact. °Monitor for COVID-19 symptoms for 10 days from the date of your last close contact. °Isolate immediately and get tested if symptoms develop. °If more than 90 days have passed since your recovery from infection, follow CDC's recommendations for close contacts. These recommendations will differ depending on your vaccination status. °05/01/2020 °Content source: National Center for Immunization and Respiratory Diseases (NCIRD), Division of Viral Diseases °This information is not intended to replace advice given to you by your health care provider. Make sure you discuss any questions you have with your health care provider. °Document Revised: 09/04/2020 Document Reviewed: 09/04/2020 °Elsevier Patient Education © 2022 [employer] Inc. ° °

## 2021-01-24 NOTE — Progress Notes (Signed)
Subjective:    Patient ID: Bridget Nolan, female    DOB: February 16, 1921, 87 y.o.   MRN: [id number]  HPI  Resident seen in [address] RN reports she is covid positive. Resident reports she has a slight cough but otherwise asymptomatic. She denies headache, runny nose, nasal congestion, ear pain, sore throat, chest pain, nausea, vomiting or diarrhea. She sleeps well. She is independent with ADL's. She walks with a rollater. Her appetite is fair, but weight is up 5 lbs. She has chronic SOB, not worse. Her bowels are moving normally. She denies urinary incontinence. She has chronic low back pain, but seems better later. She reports her mood is not good today because no one brought her any breakfast, but otherwise it has been "fine".  Constipation: Managed with Senekot and Mirilax.  Chronic Low Back Pain: Managed with Tylenol Arthritis.  Osteoporosis: She is not taking any medication for this. She gets weight bearing in daily.  CKD 3: Her last creatinine was 1, GFR 51. She is not currently on an ACEI/ARB.  Vascular Dementia: Mild cognitive, stable functional needs. She is not taking any medications for this. She is appropriate for ALF.  Review of Systems     Past Medical History:  Diagnosis Date   Cancer ([place]) skin   Cataract    Hearing loss    Osteoporosis    Vascular dementia ([place])     Current Outpatient Medications  Medication Sig Dispense Refill   acetaminophen (TYLENOL) 650 MG CR tablet Take 650 mg by mouth at bedtime.     cholecalciferol (VITAMIN D3) 25 MCG (1000 UNIT) tablet Take 1,000 Units by mouth daily.     cyanocobalamin 1000 MCG tablet Take 1,000 mcg by mouth daily.     polyethylene glycol (MIRALAX / GLYCOLAX) 17 g packet Take 17 g by mouth daily.     senna (SENOKOT) 8.6 MG tablet Take 1 tablet by mouth daily.     HYDROcodone-acetaminophen (NORCO/VICODIN) 5-325 MG tablet Take 1 tablet by mouth every 8 (eight) hours as needed for moderate pain. (Patient not taking: Reported  on 01/24/2021)     No current facility-administered medications for this visit.    Allergies  Allergen Reactions   Simvastatin     Other reaction(s): Muscle Pain   Levofloxacin Rash   Penicillins Rash    Has patient had a PCN reaction causing immediate rash, facial/tongue/throat swelling, SOB or lightheadedness with hypotension: Yes Has patient had a PCN reaction causing severe rash involving mucus membranes or skin necrosis: No Has patient had a PCN reaction that required hospitalization Yes Has patient had a PCN reaction occurring within the last 10 years: No If all of the above answers are "NO", then may proceed with Cephalosporin use.    Family History  Problem Relation Age of Onset   Thyroid cancer Mother    Kidney disease Brother    Diabetes Neg Hx     Social History   Socioeconomic History   Marital status: Widowed    Spouse name: Not on file   Number of children: 2   Years of education: Not on file   Highest education level: Not on file  Occupational History   Occupation: Housewife  Tobacco Use   Smoking status: Former    Types: Cigarettes    Quit date: 02/1943    Years since quitting: 78.0   Smokeless tobacco: Never   Tobacco comments:    Social Smoker  Substance and Sexual Activity   Alcohol use:  Yes    Comment: occ   Drug use: Never   Sexual activity: Not on file  Other Topics Concern   Not on file  Social History Narrative   Widowed ~2008   2 daughters   Has living will   Grandson Allyne Gee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   HEENT: Denies eye pain, eye redness, ear pain, ringing in the ears, wax buildup, runny nose, nasal congestion, bloody nose, or sore throat. Respiratory: Pt reports cough, chronic shortness of breath. Denies difficulty breathing.   Cardiovascular: Denies chest pain, chest tightness, palpitations or swelling in the hands or feet.  Gastrointestinal: Pt reports constipation. Denies abdominal pain, bloating, diarrhea or blood in the stool.  GU: Denies urgency, frequency, pain with urination, burning sensation, blood in urine, odor or discharge. Musculoskeletal: Pt reports intermittent back pain. Denies decrease in range of motion, difficulty with gait, or joint swelling.  Skin: Denies redness, rashes, lesions or ulcercations.  Neurological: Pt reports mild difficulty with memory. Denies dizziness, difficulty with speech or problems with balance and coordination.  Psych: Denies anxiety, depression, SI/HI.  No other specific complaints in a complete review of systems (except as listed in HPI above).  Objective:   Physical Exam   BP 120/61    Pulse 85    Temp (!) 97.4 F (36.3 C)    Resp 18    Wt 145 lb (65.8 kg)    SpO2 97%    BMI 25.69 kg/m  Wt Readings from Last 3 Encounters:  01/24/21 145 lb (65.8 kg)  10/23/20 140 lb (63.5 kg)  10/19/20 127 lb (57.6 kg)    General: Appears her stated age, well developed, well nourished in NAD. Skin: Warm, dry and intact.  HEENT: Head: normal shape and size; Cardiovascular: Normal rate and rhythm.  Pulmonary/Chest: Normal effort and positive vesicular breath sounds. No respiratory distress. No wheezes, rales or ronchi noted.  Abdomen: Soft and nontender. Normal bowel sounds.  Neurological: Alert and oriented.  Psychiatric: She seems slightly irritated today.  BMET    Component Value Date/Time   NA 127 (L) 10/18/2020 1553   NA 140 04/24/2019 0000   K 4.2 10/18/2020 1553   CL 96 (L) 10/18/2020 1553   CO2 21 (L) 10/18/2020 1553   GLUCOSE 128 (H)  10/18/2020 1553   BUN 23 10/18/2020 1553   BUN 23 (A) 04/24/2019 0000   CREATININE 1.00 10/18/2020 1553   CALCIUM 9.3 10/18/2020 1553   GFRNONAA 51 (L) 10/18/2020 1553   GFRAA 44 04/24/2019 0000    Lipid Panel  No results found for: CHOL, TRIG, HDL, CHOLHDL, VLDL, LDLCALC  CBC    Component Value Date/Time   WBC 6.7 10/18/2020 1553   RBC 4.04 10/18/2020 1553   HGB 13.6 10/18/2020 1553   HCT 38.3 10/18/2020 1553   PLT 211 10/18/2020 1553   MCV 94.8 10/18/2020 1553   MCH 33.7 10/18/2020 1553   MCHC 35.5 10/18/2020 1553   RDW 12.2 10/18/2020 1553   LYMPHSABS 0.5 (L) 02/01/2015 2208   MONOABS 0.5 02/01/2015 2208  EOSABS 0.1 02/01/2015 2208   BASOSABS 0.0 02/01/2015 2208    Hgb A1C Lab Results  Component Value Date   HGBA1C 5.5 02/03/2015           Assessment & Plan:   Covid 19:   Mild symptoms Will hold off on antiviral at this time Continue to treat with Tylenol, cough suppressants Notify NP if worse   Webb Silversmith, NP This visit occurred during the SARS-CoV-2 public health emergency.  Safety protocols were in place, including screening questions prior to the visit, additional usage of staff PPE, and extensive cleaning of exam room while observing appropriate contact time as indicated for disinfecting solutions.

## 2021-01-24 NOTE — Assessment & Plan Note (Signed)
No meds Appreciate ALF care

## 2021-01-24 NOTE — Assessment & Plan Note (Signed)
Continue Senna and Mirilax

## 2021-01-24 NOTE — Assessment & Plan Note (Signed)
Will monitor GFR

## 2021-01-24 NOTE — Assessment & Plan Note (Signed)
Continue Tylenol Arthritis

## 2021-04-24 ENCOUNTER — Ambulatory Visit (INDEPENDENT_AMBULATORY_CARE_PROVIDER_SITE_OTHER): Payer: Medicare Other | Admitting: Internal Medicine

## 2021-04-24 ENCOUNTER — Other Ambulatory Visit: Payer: Self-pay

## 2021-04-24 ENCOUNTER — Encounter: Payer: Self-pay | Admitting: Internal Medicine

## 2021-04-24 VITALS — BP 124/70 | HR 86 | Temp 97.5°F | Resp 16 | Wt 142.0 lb

## 2021-04-24 DIAGNOSIS — M545 Low back pain, unspecified: Secondary | ICD-10-CM | POA: Diagnosis not present

## 2021-04-24 DIAGNOSIS — K5900 Constipation, unspecified: Secondary | ICD-10-CM | POA: Diagnosis not present

## 2021-04-24 DIAGNOSIS — M810 Age-related osteoporosis without current pathological fracture: Secondary | ICD-10-CM

## 2021-04-24 DIAGNOSIS — F0150 Vascular dementia without behavioral disturbance: Secondary | ICD-10-CM

## 2021-04-24 DIAGNOSIS — N1832 Chronic kidney disease, stage 3b: Secondary | ICD-10-CM

## 2021-04-24 NOTE — Progress Notes (Signed)
? ?Subjective:  ? ? Patient ID: Bridget Nolan, female    DOB: 1921/04/11, 88 y.o.   MRN: [id number] ? ?HPI ?Visit in assisted living apartment for follow up of chronic health conditions ?Reviewed status with Collier Salina RN ? ?Back pain is better ?Wore a brace for a while--this is better now ?On tylenol bid ? ?Walks with rollator ?Assist with showers and dressing ?Bathroom herself--generally continent ?She notes memory issues--but no apparent deterioration ? ?Appetite is okay---down a bit this week. Usually okay ?Weight is holding ?Sleeps okay ? ?No chest pain or SOB ?No dizziness or syncope ?No palpitations ?Does get some edema---wears light compression hose ?No leg pain ? ?Last GFR 51 ?Is on lisinopril now ? ?Current Outpatient Medications on File Prior to Visit  ?Medication Sig Dispense Refill  ? acetaminophen (TYLENOL) 650 MG CR tablet Take 650 mg by mouth in the morning and at bedtime.    ? cholecalciferol (VITAMIN D3) 25 MCG (1000 UNIT) tablet Take 1,000 Units by mouth daily.    ? cyanocobalamin 1000 MCG tablet Take 1,000 mcg by mouth daily.    ? lisinopril (ZESTRIL) 5 MG tablet Take 5 mg by mouth daily.    ? polyethylene glycol (MIRALAX / GLYCOLAX) 17 g packet Take 17 g by mouth daily.    ? senna (SENOKOT) 8.6 MG tablet Take 1 tablet by mouth daily.    ? ?No current facility-administered medications on file prior to visit.  ? ? ?Allergies  ?Allergen Reactions  ? Simvastatin   ?  Other reaction(s): Muscle Pain  ? Levofloxacin Rash  ? Penicillins Rash  ?  Has patient had a PCN reaction causing immediate rash, facial/tongue/throat swelling, SOB or lightheadedness with hypotension: Yes ?Has patient had a PCN reaction causing severe rash involving mucus membranes or skin necrosis: No ?Has patient had a PCN reaction that required hospitalization Yes ?Has patient had a PCN reaction occurring within the last 10 years: No ?If all of the above answers are "NO", then may proceed with Cephalosporin use.  ? ? ?Past Medical  History:  ?Diagnosis Date  ? Cancer ([place]) skin  ? Cataract   ? Hearing loss   ? Osteoporosis   ? Vascular dementia ([place])   ? ? ?Past Surgical History:  ?Procedure Laterality Date  ? BREAST BIOPSY Left neg  ? BREAST CYST ASPIRATION Bilateral   ? EYE SURGERY Bilateral   ? HERNIA REPAIR    ? INTRAMEDULLARY (IM) NAIL INTERTROCHANTERIC Left 02/02/2015  ? Procedure: INTRAMEDULLARY (IM) NAIL INTERTROCHANTRIC;  Surgeon: Claud Kelp, MD;  Location: [facility] ORS;  Service: Orthopedics;  Laterality: Left;  ? ? ?Family History  ?Problem Relation Age of Onset  ? Thyroid cancer Mother   ? Kidney disease Brother   ? Diabetes Neg Hx   ? ? ?Social History  ? ?Socioeconomic History  ? Marital status: Widowed  ?  Spouse name: Not on file  ? Number of children: 2  ? Years of education: Not on file  ? Highest education level: Not on file  ?Occupational History  ? Occupation: Housewife  ?Tobacco Use  ? Smoking status: Former  ?  Types: Cigarettes  ?  Quit date: 02/1943  ?  Years since quitting: 78.2  ? Smokeless tobacco: Never  ? Tobacco comments:  ?  Social Smoker  ?Substance and Sexual Activity  ? Alcohol use: Yes  ?  Comment: occ  ? Drug use: Never  ? Sexual activity: Not on file  ?Other Topics Concern  ?  Not on file  ?Social History Narrative  ? Widowed ~2008  ? 2 daughters  ? Has living will  ? Grandson Allyne Gee is financial manager---?daughter for health care Lelon Frohlich or Rod Holler)  ? Requests DNR---done 09/14/19  ? ?Social Determinants of Health  ? ?Financial Resource Strain: Not on file  ?Food Insecurity: Not on file  ?Transportation Needs: Not on file  ?Physical Activity: Not on file  ?Stress: Not on file  ?Social Connections: Not on file  ?Intimate Partner Violence: Not on file  ? ?Review of Systems ?Sleeps well ?Bowels move okay ?No rashes or ulcers ?Voids well--no dysuria ?Bowels fine with miralax/senna ? ?   ?Objective:  ? Physical Exam ?Constitutional:   ?   Appearance: Normal appearance.  ?Cardiovascular:  ?   Rate and Rhythm:  Normal rate and regular rhythm.  ?   Heart sounds:  ?  No gallop.  ?   Comments: Faint systolic murmur  ?Pulmonary:  ?   Effort: Pulmonary effort is normal.  ?   Breath sounds: Normal breath sounds. No wheezing or rales.  ?Abdominal:  ?   Palpations: Abdomen is soft.  ?   Tenderness: There is no abdominal tenderness.  ?Musculoskeletal:  ?   Cervical back: Neck supple.  ?   Right lower leg: No edema.  ?   Left lower leg: No edema.  ?Lymphadenopathy:  ?   Cervical: No cervical adenopathy.  ?Neurological:  ?   Mental Status: She is alert.  ?   Comments: Engages fully but clear memory issues----repeated the same things about the building going up several times (within 1-2 minutes)  ?Psychiatric:     ?   Mood and Affect: Mood normal.     ?   Behavior: Behavior normal.  ?  ? ? ? ? ?   ?Assessment & Plan:  ? ?

## 2021-04-24 NOTE — Assessment & Plan Note (Signed)
Is considerably better now ?Just has tylenol 650 bid ?

## 2021-04-24 NOTE — Assessment & Plan Note (Signed)
Last GFR actually slightly better ?Is on lisinopril '5mg'$  daily ?Will recheck labs in May ?

## 2021-04-24 NOTE — Assessment & Plan Note (Signed)
Just on vitamin D daily ?

## 2021-04-24 NOTE — Assessment & Plan Note (Signed)
Does okay with miralax and senna ?

## 2021-04-24 NOTE — Assessment & Plan Note (Signed)
Seems to be somewhat worse---but doing well with assistance of staff here in [place] ?No Rx ?

## 2021-07-25 ENCOUNTER — Non-Acute Institutional Stay: Payer: Medicare Other | Admitting: Internal Medicine

## 2021-07-25 ENCOUNTER — Encounter: Payer: Self-pay | Admitting: Internal Medicine

## 2021-07-25 DIAGNOSIS — M810 Age-related osteoporosis without current pathological fracture: Secondary | ICD-10-CM

## 2021-07-25 DIAGNOSIS — N1832 Chronic kidney disease, stage 3b: Secondary | ICD-10-CM

## 2021-07-25 DIAGNOSIS — M545 Low back pain, unspecified: Secondary | ICD-10-CM | POA: Diagnosis not present

## 2021-07-25 DIAGNOSIS — F0150 Vascular dementia without behavioral disturbance: Secondary | ICD-10-CM | POA: Diagnosis not present

## 2021-07-25 DIAGNOSIS — I10 Essential (primary) hypertension: Secondary | ICD-10-CM | POA: Insufficient documentation

## 2021-08-29 ENCOUNTER — Non-Acute Institutional Stay: Payer: Medicare Other | Admitting: Internal Medicine

## 2021-08-29 ENCOUNTER — Encounter: Payer: Self-pay | Admitting: Internal Medicine

## 2021-08-29 VITALS — BP 141/78 | HR 89 | Temp 97.8°F | Resp 20

## 2021-08-29 DIAGNOSIS — R0602 Shortness of breath: Secondary | ICD-10-CM

## 2021-08-29 NOTE — Progress Notes (Signed)
Subjective:    Patient ID: Bridget Nolan, female    DOB: [birth year], 88 y.o.   MRN: [id number]  HPI  Asked to check resident in  [address] RN reports patient has been more short of breath.  Resident reports intermittent nonproductive cough.  Shortness of breath seems worse with exertion.  No chest pain.  RN has not noted any fever.  Resident denies URI symptoms.  She has been getting neb treatments with good relief of symptoms.  Review of Systems     Past Medical History:  Diagnosis Date   Cancer ([place]) skin   Cataract    Hearing loss    Osteoporosis    Vascular dementia ([place])     Current Outpatient Medications  Medication Sig Dispense Refill   acetaminophen (TYLENOL) 650 MG CR tablet Take 650 mg by mouth in the morning and at bedtime.     cholecalciferol (VITAMIN D3) 25 MCG (1000 UNIT) tablet Take 1,000 Units by mouth daily.     cyanocobalamin 1000 MCG tablet Take 1,000 mcg by mouth daily.     lisinopril (ZESTRIL) 5 MG tablet Take 5 mg by mouth daily.     polyethylene glycol (MIRALAX / GLYCOLAX) 17 g packet Take 17 g by mouth daily.     senna (SENOKOT) 8.6 MG tablet Take 1 tablet by mouth daily.     No current facility-administered medications for this visit.    Allergies  Allergen Reactions   Simvastatin     Other reaction(s): Muscle Pain   Levofloxacin Rash   Penicillins Rash    Has patient had a PCN reaction causing immediate rash, facial/tongue/throat swelling, SOB or lightheadedness with hypotension: Yes Has patient had a PCN reaction causing severe rash involving mucus membranes or skin necrosis: No Has patient had a PCN reaction that required hospitalization Yes Has patient had a PCN reaction occurring within the last 10 years: No If all of the above answers are "NO", then may proceed with Cephalosporin use.    Family History  Problem Relation Age of Onset   Thyroid cancer Mother    Kidney disease Brother    Diabetes Neg Hx     Social History    Socioeconomic History   Marital status: Widowed    Spouse name: Not on file   Number of children: 2   Years of education: Not on file   Highest education level: Not on file  Occupational History   Occupation: Housewife  Tobacco Use   Smoking status: Former    Types: Cigarettes    Quit date: 02/1943    Years since quitting: 78.6   Smokeless tobacco: Never   Tobacco comments:    Social Smoker  Substance and Sexual Activity   Alcohol use: Yes    Comment: occ   Drug use: Never   Sexual activity: Not on file  Other Topics Concern   Not on file  Social History Narrative   Widowed ~2008   2 daughters   Has living will   [place]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  HEENT: Denies eye pain, eye redness, ear pain, ringing in the ears, wax buildup, runny nose, nasal congestion, bloody nose, or sore throat. Respiratory: Patient reports shortness of breath.  Denies difficulty breathing, cough or sputum production.   Cardiovascular: Denies chest pain, chest tightness, palpitations or swelling in the hands or feet.   No other specific complaints in a complete review of systems (except as listed in HPI above).  Objective:   Physical Exam   BP (!) 141/78   Pulse 89   Temp 97.8 F (36.6 C)   Resp 20   SpO2 96%  Wt Readings from Last 3 Encounters:  07/25/21 142 lb 6.4 oz (64.6 kg)  04/24/21 142 lb (64.4 kg)  01/24/21 145 lb (65.8 kg)    General: Appears her stated age, chronically ill-appearing, in NAD. HEENT: Head: normal shape and size;  Throat/Mouth: mucosa pink and moist, + PND, no exudate, lesions or ulcerations noted.  Neck: No adenopathy noted. Cardiovascular: Normal rate with a irregular rhythm.  No murmur noted.  No lower extremity edema noted. Pulmonary/Chest: Normal effort and positive vesicular breath sounds with fine crackles at bilateral bases, L >R.Marland Kitchen No respiratory distress. No wheezes, or ronchi noted.   BMET    Component Value Date/Time   NA 127 (L) 10/18/2020 1553   NA 140 04/24/2019 0000   K 4.2 10/18/2020 1553   CL 96 (L) 10/18/2020 1553   CO2 21 (L) 10/18/2020 1553   GLUCOSE 128 (H) 10/18/2020 1553   BUN 23 10/18/2020 1553   BUN 23 (A) 04/24/2019 0000   CREATININE 1.00 10/18/2020 1553   CALCIUM 9.3 10/18/2020 1553   GFRNONAA 51 (L) 10/18/2020 1553   GFRAA 44 04/24/2019 0000    Lipid Panel  No results found for: "CHOL", "TRIG", "HDL", "CHOLHDL", "VLDL", "LDLCALC"  CBC    Component Value Date/Time   WBC 6.7 10/18/2020 1553   RBC 4.04 10/18/2020 1553   HGB 13.6 10/18/2020 1553   HCT 38.3 10/18/2020 1553   PLT 211 10/18/2020 1553   MCV 94.8 10/18/2020 1553   MCH 33.7 10/18/2020 1553   MCHC 35.5 10/18/2020 1553   RDW 12.2 10/18/2020 1553   LYMPHSABS 0.5 (L) 02/01/2015 2208   MONOABS 0.5 02/01/2015 2208   EOSABS 0.1 02/01/2015 2208   BASOSABS 0.0 02/01/2015 2208    Hgb A1C Lab Results  Component Value Date   HGBA1C 5.5 02/03/2015           Assessment & Plan:   Shortness of Breath:  We will obtain chest x-ray Continue DuoNebs as needed  We will follow-up after chest x-ray with further recommendation and treatment plan

## 2021-12-04 ENCOUNTER — Non-Acute Institutional Stay: Payer: Medicare Other | Admitting: Nurse Practitioner

## 2021-12-04 ENCOUNTER — Encounter: Payer: Self-pay | Admitting: Nurse Practitioner

## 2021-12-04 DIAGNOSIS — N1832 Chronic kidney disease, stage 3b: Secondary | ICD-10-CM

## 2021-12-04 DIAGNOSIS — M545 Low back pain, unspecified: Secondary | ICD-10-CM | POA: Diagnosis not present

## 2021-12-04 DIAGNOSIS — K5901 Slow transit constipation: Secondary | ICD-10-CM | POA: Diagnosis not present

## 2021-12-04 DIAGNOSIS — R0602 Shortness of breath: Secondary | ICD-10-CM | POA: Diagnosis not present

## 2021-12-04 DIAGNOSIS — F01A0 Vascular dementia, mild, without behavioral disturbance, psychotic disturbance, mood disturbance, and anxiety: Secondary | ICD-10-CM

## 2021-12-04 DIAGNOSIS — I4811 Longstanding persistent atrial fibrillation: Secondary | ICD-10-CM

## 2021-12-04 NOTE — Progress Notes (Signed)
Location:   [facility] Room Number: [place] of Service:  ALF [phone]) Provider:  Sherrie Mustache, NP  Dewayne Shorter, MD  Patient Care Team: Dewayne Shorter, MD as PCP - General (Family Medicine) Lauree Chandler, NP as Nurse Practitioner (Geriatric Medicine)  Extended Emergency Contact Information Primary Emergency Contact: Marton Redwood Address: [place] [address] [place] Phone: [phone] Relation: Daughter Secondary Emergency Contact: Vira Agar [place] of [place] Phone: [phone] Mobile Phone: [phone] Relation: Daughter  Code Status:  DNR Goals of care: Advanced Directive information    12/04/2021    1:59 PM  Advanced Directives  Does Patient Have a Medical Advance Directive? Yes  Type of Advance Directive Out of facility DNR (pink MOST or yellow form)     Chief Complaint  Patient presents with   Medical Management of Chronic Issues    Routine follow up   Immunizations    COVID vaccine,Tetanus/tdap, shingrix vaccine, pneumonia vaccine, flu vaccine   Quality Metric Gaps    Dexa scan    HPI:  Pt is a 88 y.o. female seen today for medical management of chronic diseases.   Pt is a [facility] at [facility]. Staff and family has noticed she has been more short of breath. Worse with activity.  Daughter called and reports shortness of breath has been ongoing for the last year.  In review of her chart it appears she was diagnosised with a fib and placed on metoprolol and ASA.  She has also been on prednisone since April for wheezing.   Past Medical History:  Diagnosis Date   Cancer ([place]) skin   Cataract    Hearing loss    Osteoporosis    Vascular dementia [facility])    Past Surgical History:  Procedure Laterality Date   BREAST BIOPSY Left neg   BREAST CYST ASPIRATION Bilateral    EYE SURGERY Bilateral    HERNIA REPAIR     INTRAMEDULLARY (IM)  NAIL INTERTROCHANTERIC Left 02/02/2015   Procedure: INTRAMEDULLARY (IM) NAIL INTERTROCHANTRIC;  Surgeon: Claud Kelp, MD;  Location: [facility] ORS;  Service: Orthopedics;  Laterality: Left;    Allergies  Allergen Reactions   Simvastatin     Other reaction(s): Muscle Pain   Levofloxacin Rash   Penicillins Rash    Has patient had a PCN reaction causing immediate rash, facial/tongue/throat swelling, SOB or lightheadedness with hypotension: Yes Has patient had a PCN reaction causing severe rash involving mucus membranes or skin necrosis: No Has patient had a PCN reaction that required hospitalization Yes Has patient had a PCN reaction occurring within the last 10 years: No If all of the above answers are "NO", then may proceed with Cephalosporin use.    Allergies as of 12/04/2021       Reactions   Simvastatin    Other reaction(s): Muscle Pain   Levofloxacin Rash   Penicillins Rash   Has patient had a PCN reaction causing immediate rash, facial/tongue/throat swelling, SOB or lightheadedness with hypotension: Yes Has patient had a PCN reaction causing severe rash involving mucus membranes or skin necrosis: No Has patient had a PCN reaction that required hospitalization Yes Has patient had a PCN reaction occurring within the last 10 years: No If all of the above answers are "NO", then may proceed with Cephalosporin use.        Medication List  Accurate as of December 04, 2021  3:47 PM. If you have any questions, ask your nurse or doctor.          acetaminophen 650 MG CR tablet Commonly known as: TYLENOL Take 650 mg by mouth in the morning and at bedtime.   acetaminophen 325 MG tablet Commonly known as: TYLENOL Take 650 mg by mouth every 4 (four) hours as needed.   alum & mag hydroxide-simeth 200-200-20 MG/5 Susp Commonly known as: MAALOX/MYLANTA 30 mLs every 4 (four) hours as needed.   aspirin EC 81 MG tablet Take 81 mg by mouth daily. Swallow whole.   BENADRYL  ALLERGY PO Take 1 capsule by mouth as needed.   BISMUTH SUBSALICYLATE PO Take 10 mLs by mouth as needed.   cetirizine 10 MG tablet Commonly known as: ZYRTEC Take 10 mg by mouth as needed for allergies.   cholecalciferol 25 MCG (1000 UNIT) tablet Commonly known as: VITAMIN D3 Take 1,000 Units by mouth daily.   cyanocobalamin 1000 MCG tablet Take 1,000 mcg by mouth daily.   DEBROX OT Place 5 drops in ear(s) as needed.   dextromethorphan-guaiFENesin 10-100 MG/5ML liquid Commonly known as: ROBITUSSIN-DM Take 10 mLs by mouth every 4 (four) hours as needed for cough.   Glucose 15 GM/32ML Gel Take 1 packet by mouth as needed.   HYDROcodone-acetaminophen 5-325 MG tablet Commonly known as: NORCO/VICODIN Take 0.5 tablets by mouth every 4 (four) hours as needed for moderate pain.   ipratropium-albuterol 0.5-2.5 (3) MG/3ML Soln Commonly known as: DUONEB Take 3 mLs by nebulization every 4 (four) hours as needed.   lisinopril 5 MG tablet Commonly known as: ZESTRIL Take 5 mg by mouth daily.   magnesium hydroxide 400 MG/5ML suspension Commonly known as: MILK OF MAGNESIA Take 30 mLs by mouth daily as needed for mild constipation.   metoprolol succinate 25 MG 24 hr tablet Commonly known as: TOPROL-XL Take 25 mg by mouth daily.   nystatin powder Generic drug: nystatin Apply 1 Application topically as needed.   ondansetron 4 MG tablet Commonly known as: ZOFRAN Take 4 mg by mouth as needed for nausea or vomiting. Up to 3 times a day   polyethylene glycol 17 g packet Commonly known as: MIRALAX / GLYCOLAX Take 17 g by mouth daily.   predniSONE 5 MG tablet Commonly known as: DELTASONE Take 5 mg by mouth daily.   senna 8.6 MG tablet Commonly known as: SENOKOT Take 1 tablet by mouth daily.        Review of Systems  Constitutional:  Negative for activity change, appetite change, fatigue and unexpected weight change.  HENT:  Negative for congestion and hearing loss.    Eyes: Negative.   Respiratory:  Positive for shortness of breath. Negative for cough and wheezing.   Cardiovascular:  Negative for chest pain, palpitations and leg swelling.  Gastrointestinal:  Negative for abdominal pain, constipation and diarrhea.  Genitourinary:  Negative for difficulty urinating and dysuria.  Musculoskeletal:  Negative for arthralgias and myalgias.  Skin:  Negative for color change and wound.  Neurological:  Negative for dizziness and weakness.  Psychiatric/Behavioral:  Positive for confusion. Negative for agitation and behavioral problems.     Immunization History  Administered Date(s) Administered   Influenza-Unspecified 11/19/2021   Moderna Sars-Covid-2 Vaccination 02/16/2019, 03/16/2019   Pneumococcal-Unspecified 09/04/1991   Unspecified SARS-COV-2 Vaccination 12/15/2019, 06/21/2020, 10/25/2020, 07/01/2021   Pertinent  Health Maintenance Due  Topic Date Due   DEXA SCAN  Never done   INFLUENZA VACCINE  Completed      05/11/2019    3:49 PM 05/18/2019   10:00 AM 08/07/2020    3:35 PM 10/18/2020    3:41 PM 10/19/2020   12:49 PM  [place] in the past year? 1 0     Was there an injury with Fall? 1 0     Fall Risk Category Calculator 2 0     Fall Risk Category Moderate Low     Patient Fall Risk Level Moderate fall risk Low fall risk High fall risk Moderate fall risk Moderate fall risk   Functional Status Survey:    Vitals:   12/04/21 1352  BP: 119/75  Pulse: 99  Resp: (!) 22  Temp: (!) 97.4 F (36.3 C)  SpO2: 96%  Weight: 142 lb (64.4 kg)  Height: '5\' 3"'$  (1.6 m)   Body mass index is 25.15 kg/m. Physical Exam Constitutional:      General: She is not in acute distress.    Appearance: She is well-developed. She is not diaphoretic.  HENT:     Head: Normocephalic and atraumatic.     Mouth/Throat:     Pharynx: No oropharyngeal exudate.  Eyes:     Conjunctiva/sclera: Conjunctivae normal.     Pupils: Pupils are equal, round, and reactive to  light.  Cardiovascular:     Rate and Rhythm: Normal rate and regular rhythm.     Heart sounds: Normal heart sounds.  Pulmonary:     Effort: Pulmonary effort is normal.     Breath sounds: Wheezing (faint wheezing to left lower lobe) present.  Abdominal:     General: Bowel sounds are normal.     Palpations: Abdomen is soft.  Musculoskeletal:     Cervical back: Normal range of motion and neck supple.     Right lower leg: No edema.     Left lower leg: No edema.  Skin:    General: Skin is warm and dry.  Neurological:     Mental Status: She is alert.  Psychiatric:        Mood and Affect: Mood normal.     Labs reviewed: No results for input(s): "NA", "K", "CL", "CO2", "GLUCOSE", "BUN", "CREATININE", "CALCIUM", "MG", "PHOS" in the last 8760 hours. No results for input(s): "AST", "ALT", "ALKPHOS", "BILITOT", "PROT", "ALBUMIN" in the last 8760 hours. No results for input(s): "WBC", "NEUTROABS", "HGB", "HCT", "MCV", "PLT" in the last 8760 hours. No results found for: "TSH" Lab Results  Component Value Date   HGBA1C 5.5 02/03/2015   No results found for: "CHOL", "HDL", "LDLCALC", "LDLDIRECT", "TRIG", "CHOLHDL"  Significant Diagnostic Results in last 30 days:  No results found.  Assessment/Plan 1. Mild vascular dementia without behavioral disturbance, psychotic disturbance, mood disturbance, or anxiety (HCC) -Stable, no acute changes in cognitive or functional status, continue supportive care at assisted living.  2. Slow transit constipation -controlled at this time.  3. Lumbar back pain Doing well, no worsening of pain.  4. Shortness of breath -ongoing, she has been diagnosised with a fib and on medication for rate control. Will order chest xray and echo (depending on xray results.  -will have her wean off steriods since she has been on these for several months.   5. Stage 3b chronic kidney disease ([place]) -Chronic and stable Encourage proper hydration Follow metabolic  panel Avoid nephrotoxic meds (NSAIDS)  6. Longstanding persistent atrial fibrillation ([place]) -noted on EKG from August. She has not had cardiology follow up Discussed with family and may been consultation  if shortness of breath persist Goal is to keep patient out of hospital   Saharah Sherrow K. Walton, [facility] [phone]

## 2021-12-08 LAB — CBC AND DIFFERENTIAL
HCT: 39 (ref 36–46)
Hemoglobin: 13.1 (ref 12.0–16.0)
Neutrophils Absolute: 4160
Platelets: 238 10*3/uL (ref 150–400)
WBC: 6.5

## 2021-12-08 LAB — HEPATIC FUNCTION PANEL
ALT: 11 U/L (ref 7–35)
AST: 11 — AB (ref 13–35)
Alkaline Phosphatase: 67 (ref 25–125)
Bilirubin, Total: 0.6

## 2021-12-08 LAB — BASIC METABOLIC PANEL
BUN: 30 — AB (ref 4–21)
CO2: 25 — AB (ref 13–22)
Chloride: 105 (ref 99–108)
Creatinine: 1.2 — AB (ref 0.5–1.1)
Glucose: 83
Potassium: 4.7 mEq/L (ref 3.5–5.1)
Sodium: 139 (ref 137–147)

## 2021-12-08 LAB — CBC: RBC: 3.97 (ref 3.87–5.11)

## 2021-12-08 LAB — COMPREHENSIVE METABOLIC PANEL
Albumin: 3.9 (ref 3.5–5.0)
Calcium: 9.3 (ref 8.7–10.7)
Globulin: 2.1
eGFR: 42

## 2021-12-22 ENCOUNTER — Other Ambulatory Visit: Payer: Self-pay | Admitting: Student

## 2021-12-22 DIAGNOSIS — I340 Nonrheumatic mitral (valve) insufficiency: Secondary | ICD-10-CM

## 2021-12-22 DIAGNOSIS — I5032 Chronic diastolic (congestive) heart failure: Secondary | ICD-10-CM | POA: Insufficient documentation

## 2021-12-22 DIAGNOSIS — I071 Rheumatic tricuspid insufficiency: Secondary | ICD-10-CM

## 2021-12-22 DIAGNOSIS — I351 Nonrheumatic aortic (valve) insufficiency: Secondary | ICD-10-CM

## 2021-12-22 DIAGNOSIS — I5030 Unspecified diastolic (congestive) heart failure: Secondary | ICD-10-CM | POA: Insufficient documentation

## 2021-12-22 NOTE — Progress Notes (Signed)
Patient had echocardiogram at ALF:   SIGNIFICANT FINDINGS Echocardiogram-Complete: . LEFT VENTRICLE: LVH LVEF 50% RIGHT VENTRICLE: Normal LEFT ATRIUM: Mildly enlarged RIGHT ATRIUM: Normal size MITRAL VALVE: Mild insufficiency AORTIC VALVE: Sclerosis moderate to severe insufficiency TRICUSPID VALVE: Moderate insufficiency PULMONARY VALVE: Normal AORTIC ROOT: Normal size PERICARDIUM: No effusion . IMPRESSION: LEFT VENTRICLE: LVH LVEF 50% LEFT ATRIUM: Mildly enlarged MITRAL VALVE: Mild insufficiency AORTIC VALVE: Sclerosis moderate to severe insufficiency TRICUSPID VALVE: Moderate insufficiency . Marland Kitchen Electronically Signed By: Dr. Leonidas Romberg M.D.  Will follow up with patient for regulatory visit on 11/22.

## 2021-12-24 ENCOUNTER — Non-Acute Institutional Stay: Payer: Medicare Other | Admitting: Student

## 2021-12-24 ENCOUNTER — Encounter: Payer: Self-pay | Admitting: Student

## 2021-12-24 DIAGNOSIS — I10 Essential (primary) hypertension: Secondary | ICD-10-CM

## 2021-12-24 DIAGNOSIS — F0150 Vascular dementia without behavioral disturbance: Secondary | ICD-10-CM | POA: Diagnosis not present

## 2021-12-24 DIAGNOSIS — I350 Nonrheumatic aortic (valve) stenosis: Secondary | ICD-10-CM

## 2021-12-24 DIAGNOSIS — H9193 Unspecified hearing loss, bilateral: Secondary | ICD-10-CM

## 2021-12-24 DIAGNOSIS — E782 Mixed hyperlipidemia: Secondary | ICD-10-CM | POA: Insufficient documentation

## 2021-12-24 DIAGNOSIS — E785 Hyperlipidemia, unspecified: Secondary | ICD-10-CM | POA: Insufficient documentation

## 2021-12-24 DIAGNOSIS — N1832 Chronic kidney disease, stage 3b: Secondary | ICD-10-CM

## 2021-12-24 DIAGNOSIS — S72002S Fracture of unspecified part of neck of left femur, sequela: Secondary | ICD-10-CM

## 2021-12-24 DIAGNOSIS — K5900 Constipation, unspecified: Secondary | ICD-10-CM

## 2021-12-24 DIAGNOSIS — C44309 Unspecified malignant neoplasm of skin of other parts of face: Secondary | ICD-10-CM | POA: Insufficient documentation

## 2021-12-24 DIAGNOSIS — N6019 Diffuse cystic mastopathy of unspecified breast: Secondary | ICD-10-CM | POA: Insufficient documentation

## 2021-12-24 DIAGNOSIS — M810 Age-related osteoporosis without current pathological fracture: Secondary | ICD-10-CM

## 2021-12-24 DIAGNOSIS — H25019 Cortical age-related cataract, unspecified eye: Secondary | ICD-10-CM | POA: Insufficient documentation

## 2021-12-24 DIAGNOSIS — Z8679 Personal history of other diseases of the circulatory system: Secondary | ICD-10-CM | POA: Insufficient documentation

## 2021-12-24 DIAGNOSIS — D649 Anemia, unspecified: Secondary | ICD-10-CM | POA: Insufficient documentation

## 2021-12-24 NOTE — Progress Notes (Signed)
Location:  Other [place].  N[facility]ing Home Room Number: [place] [address] Place of Service:  ALF [id number]) Provider:  Dr. Amada Kingfisher, MD  Patient Care Team: Dewayne Shorter, MD as PCP - General (Family Medicine) Lauree Chandler, NP as Nurse Practitioner (Geriatric Medicine)  Extended Emergency Contact Information Primary Emergency Contact: Marton Redwood Address: [place] [address] [place] Phone: [phone] Mobile Phone: [phone] Relation: Daughter Secondary Emergency Contact: Vira Agar States of [place] Phone: [phone] Mobile Phone: [phone] Relation: Daughter  Code Status:  DNR Goals of care: Advanced Directive information    12/04/2021    1:59 PM  Advanced Directives  Does Patient Have a Medical Advance Directive? Yes  Type of Advance Directive Out of facility DNR (pink MOST or yellow form)     Chief Complaint  Patient presents with   Acute Visit    CHF    HPI:  Pt is a 88 y.o. female seen today for an acute visit for She denies chest pain. She has had struggles with breathing and fatigue. She discusses her family photo and repeats the family story multple times. Her husband died about 12 years ago, but she also says that her family picture was 10 years ago while the husband was in the photograph.  She worked 1 year at [employer], otherwise didn't work after high school. She does not want to have a surgery.  She studied at [employer].   [place], [place]. Unsure of the year. Repeats everything, knows she is 88 years old. Gives her name.    Past Medical History:  Diagnosis Date   Cancer ([place]) skin   Cataract    Hearing loss    Osteoporosis    Vascular dementia [facility])    Past Surgical History:  Procedure Laterality Date   BREAST BIOPSY Left neg   BREAST CYST ASPIRATION Bilateral    EYE SURGERY Bilateral    HERNIA REPAIR     INTRAMEDULLARY (IM) NAIL INTERTROCHANTERIC Left  02/02/2015   Procedure: INTRAMEDULLARY (IM) NAIL INTERTROCHANTRIC;  Surgeon: Claud Kelp, MD;  Location: [facility] ORS;  Service: Orthopedics;  Laterality: Left;    Allergies  Allergen Reactions   Simvastatin     Other reaction(s): Muscle Pain   Levofloxacin Rash   Penicillins Rash    Has patient had a PCN reaction causing immediate rash, facial/tongue/throat swelling, SOB or lightheadedness with hypotension: Yes Has patient had a PCN reaction causing severe rash involving mucus membranes or skin necrosis: No Has patient had a PCN reaction that required hospitalization Yes Has patient had a PCN reaction occurring within the last 10 years: No If all of the above answers are "NO", then may proceed with Cephalosporin use.    Outpatient Encounter Medications as of 12/24/2021  Medication Sig   acetaminophen (TYLENOL) 325 MG tablet Take 650 mg by mouth every 4 (four) hours as needed.   acetaminophen (TYLENOL) 650 MG CR tablet Take 650 mg by mouth in the morning and at bedtime.   alum & mag hydroxide-simeth (MAALOX/MYLANTA) 200-200-20 MG/5ML suspension Take 30 mLs by mouth every 4 (four) hours as needed for indigestion or heartburn.   aspirin EC 81 MG tablet Take 81 mg by mouth daily. Swallow whole.   BISMUTH SUBSALICYLATE PO Take 10 mLs by mouth as needed.   Carbamide Peroxide (DEBROX OT) Place 5 drops in ear(s) as needed.   cetirizine (ZYRTEC) 10 MG tablet Take 10  mg by mouth as needed for allergies.   cholecalciferol (VITAMIN D3) 25 MCG (1000 UNIT) tablet Take 1,000 Units by mouth daily.   cyanocobalamin (VITAMIN B12) 500 MCG tablet Take 500 mcg by mouth daily.   dextromethorphan-guaiFENesin (ROBITUSSIN-DM) 10-100 MG/5ML liquid Take 10 mLs by mouth every 4 (four) hours as needed for cough.   diphenhydrAMINE HCl (BENADRYL ALLERGY PO) Take 1 capsule by mouth as needed.   Glucose 15 GM/32ML GEL Take 1 packet by mouth as needed.   HYDROcodone-acetaminophen (NORCO/VICODIN) 5-325 MG tablet Take 0.5  tablets by mouth every 4 (four) hours as needed for moderate pain.   ipratropium-albuterol (DUONEB) 0.5-2.5 (3) MG/3ML SOLN Take 3 mLs by nebulization every 4 (four) hours as needed.   lisinopril (ZESTRIL) 5 MG tablet Take 5 mg by mouth daily.   magnesium hydroxide (MILK OF MAGNESIA) 400 MG/5ML suspension Take 30 mLs by mouth daily as needed for mild constipation.   metoprolol succinate (TOPROL-XL) 25 MG 24 hr tablet Take 25 mg by mouth daily.   nystatin powder Apply 1 Application topically as needed.   ondansetron (ZOFRAN) 4 MG tablet Take 4 mg by mouth as needed for nausea or vomiting. Up to 3 times a day   polyethylene glycol (MIRALAX / GLYCOLAX) 17 g packet Take 17 g by mouth daily.   predniSONE (DELTASONE) 5 MG tablet Give 0.5 tablet by mouth one time daily every other day   senna (SENOKOT) 8.6 MG tablet Take 1 tablet by mouth daily.   [DISCONTINUED] cyanocobalamin 1000 MCG tablet Take 1,000 mcg by mouth daily.   [DISCONTINUED] predniSONE (DELTASONE) 5 MG tablet Take 5 mg by mouth daily.   No facility-administered encounter medications on file as of 12/24/2021.    Review of Systems  All other systems reviewed and are negative.   Immunization History  Administered Date(s) Administered   Influenza-Unspecified 11/19/2021   Moderna Sars-Covid-2 Vaccination 02/16/2019, 03/16/2019   Pneumococcal-Unspecified 09/04/1991   Unspecified SARS-COV-2 Vaccination 12/15/2019, 06/21/2020, 10/25/2020, 07/01/2021   Pertinent  Health Maintenance Due  Topic Date Due   DEXA SCAN  Never done   INFLUENZA VACCINE  Completed      05/11/2019    3:49 PM 05/18/2019   10:00 AM 08/07/2020    3:35 PM 10/18/2020    3:41 PM 10/19/2020   12:49 PM  [place] in the past year? 1 0     Was there an injury with Fall? 1 0     Fall Risk Category Calculator 2 0     Fall Risk Category Moderate Low     Patient Fall Risk Level Moderate fall risk Low fall risk High fall risk Moderate fall risk Moderate fall risk    Functional Status Survey:    Vitals:   12/24/21 1539  BP: 115/77  Pulse: 80  Resp: (!) 25  Temp: (!) 97.5 F (36.4 C)  SpO2: 98%  Weight: 163 lb 12.8 oz (74.3 kg)  Height: 5' (1.524 m)   Body mass index is 31.99 kg/m. Physical Exam Vitals and nursing note reviewed.  Constitutional:      Appearance: Normal appearance.  Cardiovascular:     Rate and Rhythm: Normal rate.     Pulses: Normal pulses.     Comments: 3/6 systolic ejection murmur Pulmonary:     Effort: Pulmonary effort is normal.     Comments: Coughs with deep inspiration Abdominal:     General: Abdomen is flat. Bowel sounds are normal.  Musculoskeletal:     Comments:  Trace bilateral pedal edema  Skin:    General: Skin is warm and dry.     Capillary Refill: Capillary refill takes less than 2 seconds.  Neurological:     General: No focal deficit present.     Mental Status: She is alert. Mental status is at baseline.     Labs reviewed: Recent Labs    12/08/21 0000  NA 139  K 4.7  CL 105  CO2 25*  BUN 30*  CREATININE 1.2*  CALCIUM 9.3   Recent Labs    12/08/21 0000  AST 11*  ALT 11  ALKPHOS 67  ALBUMIN 3.9   Recent Labs    12/08/21 0000  WBC 6.5  NEUTROABS 4,160.00  HGB 13.1  HCT 39  PLT 238   No results found for: "TSH" Lab Results  Component Value Date   HGBA1C 5.5 02/03/2015   No results found for: "CHOL", "HDL", "LDLCALC", "LDLDIRECT", "TRIG", "CHOLHDL"  Significant Diagnostic Results in last 30 days:  No results found.  Assessment/Plan 1. Severe aortic stenosis Patient's most recent echocardiogram EF 50% however significant aortic stenosis as well insufficiency of mitral valve and tricuspid valve. Continues to have some dyspnea with exertion. Patient is aware that she does not have any options other than a surgical procedure/intervention, and she is not open to any invasive interventions. Discussed making sure patient symptoms are well-controlled which is aligned with her  goals. Continue metoprolol 25 mg daily, aspirin 81 mg daily, lisinopril 5 mg daily. Defer initiation of diuretic at this time based on patient's blood pressures.   2. Primary hypertension BP well-controlled on current regimen.   3. Bilateral hearing loss, unspecified hearing loss type Continue HA   4. Vascular dementia without behavioral disturbance, psychotic disturbance, mood disturbance, or anxiety, unspecified dementia severity ([place]) Patient continues to require assisted living. Ambulatory and independent with feeding. She is able to go on walks around the community alone without getting lost or wandering.   5. Age-related osteoporosis without current pathological fracture 6. Closed fracture of left hip requiring operative repair, sequela Fracture 02/01/2015. No issues at this time. Rollator dependent. CTM.   7. Stage 3b chronic kidney disease ([place]) Kidney function stable GFR 42 Continue to monitor with quarterly labs.   8. Constipation, unspecified constipation type Normal bowel movements with PRN miralax    Family/ staff Communication: nursing  Labs/tests ordered:  none

## 2022-02-18 ENCOUNTER — Encounter: Payer: Self-pay | Admitting: Student

## 2022-02-18 ENCOUNTER — Non-Acute Institutional Stay: Payer: Medicare Other | Admitting: Student

## 2022-02-18 DIAGNOSIS — N1832 Chronic kidney disease, stage 3b: Secondary | ICD-10-CM | POA: Diagnosis not present

## 2022-02-18 DIAGNOSIS — I350 Nonrheumatic aortic (valve) stenosis: Secondary | ICD-10-CM | POA: Diagnosis not present

## 2022-02-18 DIAGNOSIS — R062 Wheezing: Secondary | ICD-10-CM

## 2022-02-18 DIAGNOSIS — F0150 Vascular dementia without behavioral disturbance: Secondary | ICD-10-CM

## 2022-02-18 NOTE — Progress Notes (Signed)
Location:  Other [place].  Nursing Home Room Number: Bridget Nolan [id number] Place of Service:  ALF [phone]) Provider:  Dewayne Shorter, MD  Patient Care Team: Dewayne Shorter, MD as PCP - General (Family Medicine) Lauree Chandler, NP as Nurse Practitioner (Geriatric Medicine)  Extended Emergency Contact Information Primary Emergency Contact: Marton Redwood Address: [place] [address] [place] Phone: [phone] Mobile Phone: [phone] Relation: Daughter Secondary Emergency Contact: Vira Agar [place] Phone: [phone] Mobile Phone: [phone] Relation: Daughter  Code Status:  DNR Goals of care: Advanced Directive information    02/18/2022    8:47 AM  Advanced Directives  Does Patient Have a Medical Advance Directive? Yes  Type of Advance Directive Out of facility DNR (pink MOST or yellow form)  Does patient want to make changes to medical advance directive? No - Patient declined  Would patient like information on creating a medical advance directive? No - Patient declined     Chief Complaint  Patient presents with   Acute Visit    SOB,AFIB    HPI:  Pt is a 89 y.o. female seen today for an acute visit for shortness of breath. Patient has had more labored breathing with ADLs. She is unable to give a full history based on poor recollection. She doesn't remember meeiting the medical team. Difficult remembering where she lives/stays. "I'm not as good as I used to be." Nurse helps with history today.   She has duonebs as needed and they tried those -- especially in the morning and that has not made a big impact on her breathing.   Her oxygen level stays in the 90s. Nursing says for the last few weeks she has said she wanted to leave and move out. She has been more confused than normal. She has been sleeping fine. She wakes up early.   Past Medical History:  Diagnosis Date   Cancer ([place]) skin    Cataract    Hearing loss    Osteoporosis    Vascular dementia [facility])    Past Surgical History:  Procedure Laterality Date   BREAST BIOPSY Left neg   BREAST CYST ASPIRATION Bilateral    EYE SURGERY Bilateral    HERNIA REPAIR     INTRAMEDULLARY (IM) NAIL INTERTROCHANTERIC Left 02/02/2015   Procedure: INTRAMEDULLARY (IM) NAIL INTERTROCHANTRIC;  Surgeon: Claud Kelp, MD;  Location: [facility] ORS;  Service: Orthopedics;  Laterality: Left;    Allergies  Allergen Reactions   Simvastatin     Other reaction(s): Muscle Pain   Levofloxacin Rash   Penicillins Rash    Has patient had a PCN reaction causing immediate rash, facial/tongue/throat swelling, SOB or lightheadedness with hypotension: Yes Has patient had a PCN reaction causing severe rash involving mucus membranes or skin necrosis: No Has patient had a PCN reaction that required hospitalization Yes Has patient had a PCN reaction occurring within the last 10 years: No If all of the above answers are "NO", then may proceed with Cephalosporin use.    Outpatient Encounter Medications as of 02/18/2022  Medication Sig   acetaminophen (TYLENOL) 325 MG tablet Take 650 mg by mouth every 4 (four) hours as needed.   acetaminophen (TYLENOL) 650 MG CR tablet Take 650 mg by mouth in the morning and at bedtime.   alum & mag hydroxide-simeth (MAALOX/MYLANTA) 200-200-20 MG/5ML suspension Take 30 mLs by mouth every 4 (four) hours as needed for indigestion or heartburn.  aspirin EC 81 MG tablet Take 81 mg by mouth daily. Swallow whole.   BISMUTH SUBSALICYLATE PO Take 10 mLs by mouth as needed.   Carbamide Peroxide (DEBROX OT) Place 5 drops in ear(s) as needed.   cetirizine (ZYRTEC) 10 MG tablet Take 10 mg by mouth as needed for allergies.   cholecalciferol (VITAMIN D3) 25 MCG (1000 UNIT) tablet Take 1,000 Units by mouth daily.   cyanocobalamin (VITAMIN B12) 500 MCG tablet Take 500 mcg by mouth daily.   dextromethorphan-guaiFENesin (ROBITUSSIN-DM) 10-100  MG/5ML liquid Take 10 mLs by mouth every 4 (four) hours as needed for cough.   Glucose 15 GM/32ML GEL Take 1 packet by mouth as needed.   HYDROcodone-acetaminophen (NORCO/VICODIN) 5-325 MG tablet Take 0.5 tablets by mouth every 4 (four) hours as needed for moderate pain.   ipratropium-albuterol (DUONEB) 0.5-2.5 (3) MG/3ML SOLN Take 3 mLs by nebulization every 6 (six) hours as needed.   lisinopril (ZESTRIL) 5 MG tablet Take 5 mg by mouth daily.   magnesium hydroxide (MILK OF MAGNESIA) 400 MG/5ML suspension Take 30 mLs by mouth daily as needed for mild constipation.   metoprolol succinate (TOPROL-XL) 25 MG 24 hr tablet Take 25 mg by mouth daily.   nystatin powder Apply 1 Application topically as needed.   ondansetron (ZOFRAN) 4 MG tablet Take 4 mg by mouth as needed for nausea or vomiting. Up to 3 times a day   OXYGEN 2lpm via n/c for dyspnea or SOB.   polyethylene glycol (MIRALAX / GLYCOLAX) 17 g packet Take 17 g by mouth daily.   senna (SENOKOT) 8.6 MG tablet Take 1 tablet by mouth daily.   [DISCONTINUED] diphenhydrAMINE HCl (BENADRYL ALLERGY PO) Take 1 capsule by mouth as needed.   [DISCONTINUED] predniSONE (DELTASONE) 5 MG tablet Give 0.5 tablet by mouth one time daily every other day   No facility-administered encounter medications on file as of 02/18/2022.    Review of Systems  All other systems reviewed and are negative.   Immunization History  Administered Date(s) Administered   Influenza-Unspecified 11/21/2019, 11/19/2020, 11/19/2021   Moderna Sars-Covid-2 Vaccination 02/16/2019, 03/16/2019   Pneumococcal-Unspecified 09/04/1991   Unspecified SARS-COV-2 Vaccination 12/15/2019, 06/21/2020, 10/25/2020, 07/01/2021   Pertinent  Health Maintenance Due  Topic Date Due   DEXA SCAN  Never done   INFLUENZA VACCINE  Completed      05/11/2019    3:49 PM 05/18/2019   10:00 AM 08/07/2020    3:35 PM 10/18/2020    3:41 PM 10/19/2020   12:49 PM  [place] in the past year? 1 0     Was  there an injury with Fall? 1 0     Fall Risk Category Calculator 2 0     Fall Risk Category (Retired) Moderate Low     (RETIRED) Patient Fall Risk Level Moderate fall risk Low fall risk High fall risk Moderate fall risk Moderate fall risk   Functional Status Survey:    Vitals:   02/18/22 0828  BP: 135/75  Pulse: 75  Resp: 18  Temp: (!) 97.5 F (36.4 C)  SpO2: 92%  Weight: 140 lb 3.2 oz (63.6 kg)  Height: '5\' 3"'$  (1.6 m)   Body mass index is 24.84 kg/m. Physical Exam Constitutional:      Appearance: Normal appearance.  Pulmonary:     Breath sounds: Rhonchi: tnes.     Comments: Labored breathing with audible wheeze, however, no wheezing with auscultation of lower, middle, and upper lobes.  Neurological:  Mental Status: She is alert.    Labs reviewed: Recent Labs    12/08/21 0000  NA 139  K 4.7  CL 105  CO2 25*  BUN 30*  CREATININE 1.2*  CALCIUM 9.3   Recent Labs    12/08/21 0000  AST 11*  ALT 11  ALKPHOS 67  ALBUMIN 3.9   Recent Labs    12/08/21 0000  WBC 6.5  NEUTROABS 4,160.00  HGB 13.1  HCT 39  PLT 238   No results found for: "TSH" Lab Results  Component Value Date   HGBA1C 5.5 02/03/2015   No results found for: "CHOL", "HDL", "LDLCALC", "LDLDIRECT", "TRIG", "CHOLHDL"  Significant Diagnostic Results in last 30 days:  No results found.  Assessment/Plan Severe aortic stenosis  Vascular dementia without behavioral disturbance, psychotic disturbance, mood disturbance, or anxiety, unspecified dementia severity ([place])  Stage 3b chronic kidney disease ([place]) Patient with progressive decline of memory changes with increased confusion. No new behaviors noted by care team. She has had progressive wheezing and dyspnea on exertion. Concern for worsening symptoms from aortic stenosis. CXR with no notable pulmonary edema or pneumonia, however, did not a vertebral compression fracture. Willl follow up labs to determine if they are revealing for a possible  cause. Continue supportive care. Will contact family when labs have resulted.    Family/ staff Communication: Will call family once labs have resulted.   Labs/tests ordered:  CBC, BMP, Pro BNP, CXR.

## 2022-02-19 LAB — BASIC METABOLIC PANEL
BUN: 25 — AB (ref 4–21)
CO2: 26 — AB (ref 13–22)
Chloride: 106 (ref 99–108)
Creatinine: 1 (ref 0.5–1.1)
Glucose: 82
Potassium: 4.5 mEq/L (ref 3.5–5.1)
Sodium: 137 (ref 137–147)

## 2022-02-19 LAB — CBC AND DIFFERENTIAL
HCT: 39 (ref 36–46)
Hemoglobin: 12.9 (ref 12.0–16.0)
Neutrophils Absolute: 3048
Platelets: 184 10*3/uL (ref 150–400)
WBC: 4.9

## 2022-02-19 LAB — COMPREHENSIVE METABOLIC PANEL
Calcium: 9.2 (ref 8.7–10.7)
eGFR: 49

## 2022-02-19 LAB — CBC: RBC: 4 (ref 3.87–5.11)

## 2022-03-05 LAB — BASIC METABOLIC PANEL
BUN: 31 — AB (ref 4–21)
CO2: 28 — AB (ref 13–22)
Chloride: 102 (ref 99–108)
Creatinine: 1.1 (ref 0.5–1.1)
Glucose: 86
Potassium: 4.6 mEq/L (ref 3.5–5.1)
Sodium: 138 (ref 137–147)

## 2022-03-05 LAB — COMPREHENSIVE METABOLIC PANEL
Calcium: 9.6 (ref 8.7–10.7)
eGFR: 44

## 2022-05-19 ENCOUNTER — Non-Acute Institutional Stay: Payer: Medicare Other | Admitting: Nurse Practitioner

## 2022-05-19 ENCOUNTER — Encounter: Payer: Self-pay | Admitting: Nurse Practitioner

## 2022-05-19 DIAGNOSIS — N1832 Chronic kidney disease, stage 3b: Secondary | ICD-10-CM | POA: Diagnosis not present

## 2022-05-19 DIAGNOSIS — I10 Essential (primary) hypertension: Secondary | ICD-10-CM | POA: Diagnosis not present

## 2022-05-19 DIAGNOSIS — I350 Nonrheumatic aortic (valve) stenosis: Secondary | ICD-10-CM | POA: Diagnosis not present

## 2022-05-19 DIAGNOSIS — F0150 Vascular dementia without behavioral disturbance: Secondary | ICD-10-CM | POA: Diagnosis not present

## 2022-05-19 DIAGNOSIS — M810 Age-related osteoporosis without current pathological fracture: Secondary | ICD-10-CM

## 2022-05-19 NOTE — Progress Notes (Signed)
Location:  Other [facility] [place].  Nursing Home Room Number: Virl Son [id number] Place of Service:  ALF [phone]) Abbey Chatters, NP  PCP: Earnestine Mealing, MD  Patient Care Team: Earnestine Mealing, MD as PCP - General (Family Medicine) Sharon Seller, NP as Nurse Practitioner (Geriatric Medicine)  Extended Emergency Contact Information Primary Emergency Contact: Gwynneth Aliment Address: [place] [address] [place] Home Phone: [phone] Mobile Phone: [phone] Relation: Daughter Secondary Emergency Contact: Theodosia Blender [place] Home Phone: [phone] Mobile Phone: [phone] Relation: Daughter  Goals of care: Advanced Directive information    05/19/2022   12:05 PM  Advanced Directives  Does Patient Have a Medical Advance Directive? No  Would patient like information on creating a medical advance directive? No - Patient declined     Chief Complaint  Patient presents with   Medical Management of Chronic Issues    Medical Management of Chronic Issues.     HPI:  Pt is a 89 y.o. female seen today for medical management of chronic disease. Pt is in assisted living at [facility].   Pt with hx of a fib, aortic insuff, HOH, OP and dementia  She has shortness of breath but this has improved since being on lasix.   She remains active in facility. Memory progressively worsening but functions well and doing well in AL.   Past Medical History:  Diagnosis Date   Cancer skin   Cataract    Hearing loss    Osteoporosis    Vascular dementia    Past Surgical History:  Procedure Laterality Date   BREAST BIOPSY Left neg   BREAST CYST ASPIRATION Bilateral    EYE SURGERY Bilateral    HERNIA REPAIR     INTRAMEDULLARY (IM) NAIL INTERTROCHANTERIC Left 02/02/2015   Procedure: INTRAMEDULLARY (IM) NAIL INTERTROCHANTRIC;  Surgeon: Danelle Earthly, MD;  Location: [facility] ORS;  Service: Orthopedics;  Laterality: Left;    Allergies   Allergen Reactions   Simvastatin     Other reaction(s): Muscle Pain   Levofloxacin Rash   Penicillins Rash    Has patient had a PCN reaction causing immediate rash, facial/tongue/throat swelling, SOB or lightheadedness with hypotension: Yes Has patient had a PCN reaction causing severe rash involving mucus membranes or skin necrosis: No Has patient had a PCN reaction that required hospitalization Yes Has patient had a PCN reaction occurring within the last 10 years: No If all of the above answers are "NO", then may proceed with Cephalosporin use.    Outpatient Encounter Medications as of 05/19/2022  Medication Sig   acetaminophen (TYLENOL) 325 MG tablet Take 650 mg by mouth every 4 (four) hours as needed.   acetaminophen (TYLENOL) 650 MG CR tablet Take 650 mg by mouth in the morning and at bedtime.   alum & mag hydroxide-simeth (MAALOX/MYLANTA) 200-200-20 MG/5ML suspension Take 30 mLs by mouth every 4 (four) hours as needed for indigestion or heartburn.   aspirin EC 81 MG tablet Take 81 mg by mouth daily. Swallow whole.   BISMUTH SUBSALICYLATE PO Take 10 mLs by mouth as needed.   Carbamide Peroxide (DEBROX OT) Place 5 drops in ear(s) as needed.   cetirizine (ZYRTEC) 10 MG tablet Take 10 mg by mouth as needed for allergies.   cholecalciferol (VITAMIN D3) 25 MCG (1000 UNIT) tablet Take 1,000 Units by mouth daily.   cyanocobalamin (VITAMIN B12) 500 MCG tablet Take 500 mcg by mouth daily.   dextromethorphan-guaiFENesin (  ROBITUSSIN-DM) 10-100 MG/5ML liquid Take 10 mLs by mouth every 4 (four) hours as needed for cough.   furosemide (LASIX) 40 MG tablet Take 40 mg by mouth daily.   Glucose 15 GM/32ML GEL Take 1 packet by mouth as needed.   ipratropium-albuterol (DUONEB) 0.5-2.5 (3) MG/3ML SOLN Take 3 mLs by nebulization every 6 (six) hours as needed.   lisinopril (ZESTRIL) 5 MG tablet Take 5 mg by mouth daily.   magnesium hydroxide (MILK OF MAGNESIA) 400 MG/5ML suspension Take 30 mLs by mouth  daily as needed for mild constipation.   metoprolol succinate (TOPROL-XL) 25 MG 24 hr tablet Take 25 mg by mouth daily.   nystatin powder Apply 1 Application topically as needed.   ondansetron (ZOFRAN) 4 MG tablet Take 4 mg by mouth as needed for nausea or vomiting. Up to 3 times a day   OXYGEN 2lpm via n/c for dyspnea or SOB.   polyethylene glycol (MIRALAX / GLYCOLAX) 17 g packet Take 17 g by mouth daily.   senna (SENOKOT) 8.6 MG tablet Take 1 tablet by mouth daily.   [DISCONTINUED] HYDROcodone-acetaminophen (NORCO/VICODIN) 5-325 MG tablet Take 0.5 tablets by mouth every 4 (four) hours as needed for moderate pain.   No facility-administered encounter medications on file as of 05/19/2022.    Review of Systems  Constitutional:  Negative for activity change, appetite change, fatigue and unexpected weight change.  HENT:  Negative for congestion and hearing loss.   Eyes: Negative.   Respiratory:  Negative for cough and shortness of breath.   Cardiovascular:  Negative for chest pain, palpitations and leg swelling.  Gastrointestinal:  Negative for abdominal pain, constipation and diarrhea.  Genitourinary:  Negative for difficulty urinating and dysuria.  Musculoskeletal:  Negative for arthralgias and myalgias.  Skin:  Negative for color change and wound.  Neurological:  Negative for dizziness and weakness.  Psychiatric/Behavioral:  Negative for agitation, behavioral problems and confusion.      Immunization History  Administered Date(s) Administered   Influenza-Unspecified 11/21/2019, 11/19/2020, 11/19/2021   Moderna Sars-Covid-2 Vaccination 02/16/2019, 03/16/2019, 12/12/2021, 05/12/2022   Pneumococcal-Unspecified 09/04/1991   Unspecified SARS-COV-2 Vaccination 12/15/2019, 06/21/2020, 10/25/2020, 07/01/2021   Pertinent  Health Maintenance Due  Topic Date Due   DEXA SCAN  Never done   INFLUENZA VACCINE  09/03/2022      05/11/2019    3:49 PM 05/18/2019   10:00 AM 08/07/2020    3:35 PM  10/18/2020    3:41 PM 10/19/2020   12:49 PM  Fall Risk  Falls in the past year? 1 0     Was there an injury with Fall? 1 0     Fall Risk Category Calculator 2 0     Fall Risk Category (Retired) Moderate Low     (RETIRED) Patient Fall Risk Level Moderate fall risk Low fall risk High fall risk Moderate fall risk Moderate fall risk   Functional Status Survey:    Vitals:   05/19/22 1157  BP: 104/63  Pulse: 70  Resp: 18  Temp: (!) 97.4 F (36.3 C)  SpO2: 96%  Weight: 140 lb (63.5 kg)  Height: 5\' 3"  (1.6 m)   Body mass index is 24.8 kg/m. Physical Exam Constitutional:      General: She is not in acute distress.    Appearance: She is well-developed. She is not diaphoretic.  HENT:     Head: Normocephalic and atraumatic.     Mouth/Throat:     Pharynx: No oropharyngeal exudate.  Eyes:  Conjunctiva/sclera: Conjunctivae normal.     Pupils: Pupils are equal, round, and reactive to light.  Cardiovascular:     Rate and Rhythm: Normal rate and regular rhythm.     Heart sounds: Normal heart sounds.  Pulmonary:     Effort: Pulmonary effort is normal.     Breath sounds: Normal breath sounds.  Abdominal:     General: Bowel sounds are normal.     Palpations: Abdomen is soft.  Musculoskeletal:     Cervical back: Normal range of motion and neck supple.     Right lower leg: No edema.     Left lower leg: No edema.  Skin:    General: Skin is warm and dry.  Neurological:     Mental Status: She is alert. Mental status is at baseline.  Psychiatric:        Mood and Affect: Mood normal.     Labs reviewed: Recent Labs    12/08/21 0000 02/19/22 0000 03/05/22 0000  NA 139 137 138  K 4.7 4.5 4.6  CL 105 106 102  CO2 25* 26* 28*  BUN 30* 25* 31*  CREATININE 1.2* 1.0 1.1  CALCIUM 9.3 9.2 9.6   Recent Labs    12/08/21 0000  AST 11*  ALT 11  ALKPHOS 67  ALBUMIN 3.9   Recent Labs    12/08/21 0000 02/19/22 0000  WBC 6.5 4.9  NEUTROABS 4,160.00 3,048.00  HGB 13.1 12.9   HCT 39 39  PLT 238 184   No results found for: "TSH" Lab Results  Component Value Date   HGBA1C 5.5 02/03/2015   No results found for: "CHOL", "HDL", "LDLCALC", "LDLDIRECT", "TRIG", "CHOLHDL"  Significant Diagnostic Results in last 30 days:  No results found.  Assessment/Plan 1. Stage 3b chronic kidney disease -Chronic and stable Encourage proper hydration Follow metabolic panel Avoid nephrotoxic meds (NSAIDS  2. Primary hypertension -Blood pressure well controlled, goal bp <140/90 Continue current medications and dietary modifications follow metabolic panel  3. Severe aortic stenosis -stable on lasix daily. Shortness of breath has improved. Will continue to monitor.   Follow up CMP   4. Vascular dementia without behavioral disturbance, psychotic disturbance, mood disturbance, or anxiety, unspecified dementia severity -Stable, no acute changes in cognitive or functional status, continue supportive care with staff and family  5. Age-related osteoporosis without current pathological fracture -Recommended to take calcium 600 mg twice daily with Vitamin D 2000 units daily and weight bearing activity 30 mins/5 days a week  Yomaris Palecek K. Biagio Borg [facility] [phone]

## 2022-05-21 LAB — CBC AND DIFFERENTIAL
HCT: 40 (ref 36–46)
Hemoglobin: 13.2 (ref 12.0–16.0)
Neutrophils Absolute: 2900
Platelets: 199 10*3/uL (ref 150–400)
WBC: 4.7

## 2022-05-21 LAB — BASIC METABOLIC PANEL
BUN: 42 — AB (ref 4–21)
CO2: 25 — AB (ref 13–22)
Chloride: 105 (ref 99–108)
Creatinine: 1.5 — AB (ref 0.5–1.1)
Glucose: 93
Potassium: 4.7 mEq/L (ref 3.5–5.1)
Sodium: 137 (ref 137–147)

## 2022-05-21 LAB — HEPATIC FUNCTION PANEL
ALT: 10 U/L (ref 7–35)
AST: 15 (ref 13–35)
Alkaline Phosphatase: 80 (ref 25–125)
Bilirubin, Total: 0.7

## 2022-05-21 LAB — CBC: RBC: 4.02 (ref 3.87–5.11)

## 2022-05-21 LAB — COMPREHENSIVE METABOLIC PANEL
Albumin: 3.9 (ref 3.5–5.0)
Calcium: 9.6 (ref 8.7–10.7)
Globulin: 2.3

## 2022-07-01 ENCOUNTER — Encounter: Payer: Self-pay | Admitting: Nurse Practitioner

## 2022-07-01 ENCOUNTER — Non-Acute Institutional Stay (INDEPENDENT_AMBULATORY_CARE_PROVIDER_SITE_OTHER): Payer: Medicare Other | Admitting: Nurse Practitioner

## 2022-07-01 DIAGNOSIS — Z0000 Encounter for general adult medical examination without abnormal findings: Secondary | ICD-10-CM | POA: Diagnosis not present

## 2022-07-01 NOTE — Patient Instructions (Addendum)
  Ms. Portmann , Thank you for taking time to come for your Medicare Wellness Visit. I appreciate your ongoing commitment to your health goals. Please review the following plan we discussed and let me know if I can assist you in the future.   These are the goals we discussed:  Goals   None     This is a list of the screening recommended for you and due dates:  Health Maintenance  Topic Date Due   DTaP/Tdap/Td vaccine (1 - Tdap) Never done   Zoster (Shingles) Vaccine (1 of 2) Never done   Pneumonia Vaccine (1 of 1 - PCV) 09/03/1988   DEXA scan (bone density measurement)  Never done   COVID-19 Vaccine (9 - 2023-24 season) 07/07/2022   Flu Shot  09/03/2022   Medicare Annual Wellness Visit  07/01/2023   HPV Vaccine  Aged Out   You are due for TDAP, shingles and pneumonia vaccine- we can give these vaccines in facility if you are willing and will spread them out so you are not getting all vaccines at once.  Will call your daughter to help you make the decision.

## 2022-07-01 NOTE — Progress Notes (Signed)
Subjective:   Bridget Nolan is a 89 y.o. female who presents for Medicare Annual (Subsequent) preventive examination.  Review of Systems     Cardiac Risk Factors include: advanced age (>26men, >82 women);hypertension     Objective:    Today's Vitals   07/01/22 1030  BP: 124/73  Pulse: 88  Weight: 140 lb (63.5 kg)  Height: 5\' 3"  (1.6 m)  PainSc: 0-No pain   Body mass index is 24.8 kg/m.     07/01/2022   10:31 AM 05/19/2022   12:05 PM 02/18/2022    8:47 AM 12/04/2021    1:59 PM 10/19/2020   12:49 PM 10/18/2020    3:41 PM 08/07/2020    3:34 PM  Advanced Directives  Does Patient Have a Medical Advance Directive? Yes No Yes Yes Yes Yes Yes  Type of Advance Directive Out of facility DNR (pink MOST or yellow form)  Out of facility DNR (pink MOST or yellow form) Out of facility DNR (pink MOST or yellow form) Out of facility DNR (pink MOST or yellow form) Out of facility DNR (pink MOST or yellow form) Out of facility DNR (pink MOST or yellow form)  Does patient want to make changes to medical advance directive? No - Patient declined  No - Patient declined   No - Patient declined   Would patient like information on creating a medical advance directive?  No - Patient declined No - Patient declined      Pre-existing out of facility DNR order (yellow form or pink MOST form)      Yellow form placed in chart (order not valid for inpatient use) Yellow form placed in chart (order not valid for inpatient use)    Current Medications (verified) Outpatient Encounter Medications as of 07/01/2022  Medication Sig   acetaminophen (TYLENOL) 325 MG tablet Take 650 mg by mouth every 4 (four) hours as needed.   acetaminophen (TYLENOL) 650 MG CR tablet Take 650 mg by mouth in the morning and at bedtime.   alum & mag hydroxide-simeth (MAALOX/MYLANTA) 200-200-20 MG/5ML suspension Give 2 Tbsp by mouth every 4 hours as needed for gas, indigestion, or upset stomach supervised self-administration Notify MD if  no relief.   aspirin EC 81 MG tablet Take 81 mg by mouth daily. Swallow whole.   BISMUTH SUBSALICYLATE PO Take 10 mLs by mouth as needed.   Carbamide Peroxide (DEBROX OT) Place 5 drops in ear(s) as needed.   cetirizine (ZYRTEC) 10 MG tablet Take 10 mg by mouth as needed for allergies.   cholecalciferol (VITAMIN D3) 25 MCG (1000 UNIT) tablet Take 1,000 Units by mouth daily.   cyanocobalamin (VITAMIN B12) 500 MCG tablet Take 500 mcg by mouth daily.   dextromethorphan-guaiFENesin (ROBITUSSIN-DM) 10-100 MG/5ML liquid Give 2 tsp by mouth every 4 hours as needed for coughing. supervised self-administration Notify MD if no relief in 3 days or continued use   furosemide (LASIX) 40 MG tablet Take 40 mg by mouth daily.   Glucose 15 GM/32ML GEL Take 1 packet by mouth as needed.   ipratropium-albuterol (DUONEB) 0.5-2.5 (3) MG/3ML SOLN Take 3 mLs by nebulization every 6 (six) hours as needed.   lisinopril (ZESTRIL) 5 MG tablet Take 5 mg by mouth daily.   magnesium hydroxide (MILK OF MAGNESIA) 400 MG/5ML suspension Give 2 Tbsp by mouth as needed for constipation. supervised self-administration daily  Call MD if no relief in 3 days of continued use.   metoprolol succinate (TOPROL-XL) 25 MG 24 hr tablet Take  25 mg by mouth daily.   nystatin powder Apply 1 Application topically as needed.   ondansetron (ZOFRAN) 4 MG tablet Take 4 mg by mouth as needed for nausea or vomiting. Up to 3 times a day   OXYGEN 2lpm via n/c for dyspnea or SOB.   polyethylene glycol (MIRALAX / GLYCOLAX) 17 g packet Take 17 g by mouth daily.   senna (SENOKOT) 8.6 MG tablet Take 1 tablet by mouth daily.   No facility-administered encounter medications on file as of 07/01/2022.    Allergies (verified) Simvastatin, Levofloxacin, and Penicillins   History: Past Medical History:  Diagnosis Date   Cancer (HCC) skin   Cataract    Hearing loss    Osteoporosis    Vascular dementia (HCC)    Past Surgical History:  Procedure  Laterality Date   BREAST BIOPSY Left neg   BREAST CYST ASPIRATION Bilateral    EYE SURGERY Bilateral    HERNIA REPAIR     INTRAMEDULLARY (IM) NAIL INTERTROCHANTERIC Left 02/02/2015   Procedure: INTRAMEDULLARY (IM) NAIL INTERTROCHANTRIC;  Surgeon: Danelle Earthly, MD;  Location: [facility] ORS;  Service: Orthopedics;  Laterality: Left;   Family History  Problem Relation Age of Onset   Thyroid cancer Mother    Kidney disease Brother    Diabetes Neg Hx    Social History   Socioeconomic History   Marital status: Widowed    Spouse name: Not on file   Number of children: 2   Years of education: Not on file   Highest education level: Not on file  Occupational History   Occupation: Housewife  Tobacco Use   Smoking status: Former    Types: Cigarettes    Quit date: 02/1943    Years since quitting: 79.4   Smokeless tobacco: Never   Tobacco comments:    Social Smoker  Vaping Use   Vaping Use: Never used  Substance and Sexual Activity   Alcohol use: Yes    Comment: occ   Drug use: Never   Sexual activity: Not on file  Other Topics Concern   Not on file  Social History Narrative   Widowed ~2008   2 daughters   Has living will   Grandson Armanda Magic is financial manager---?daughter for health care Dewayne Hatch or Windell Moulding)   Requests DNR---done 09/14/19   Social Determinants of Health   Financial Resource Strain: Not on file  Food Insecurity: Not on file  Transportation Needs: Not on file  Physical Activity: Not on file  Stress: Not on file  Social Connections: Not on file    Tobacco Counseling Counseling given: Not Answered Tobacco comments: Social Smoker   Clinical Intake:  Pre-visit preparation completed: Yes  Pain : No/denies pain Pain Score: 0-No pain     BMI - recorded: 24.8 Nutritional Status: BMI of 19-24  Normal Diabetes: No  How often do you need to have someone help you when you read instructions, pamphlets, or other written materials from your doctor or  pharmacy?: 4 - Often  Diabetic?no         Activities of Daily Living    07/01/2022   10:24 AM  In your present state of health, do you have any difficulty performing the following activities:  Hearing? 1  Vision? 0  Difficulty concentrating or making decisions? 1  Walking or climbing stairs? 1  Comment uses walker  Dressing or bathing? 0  Doing errands, shopping? 1  Comment does not drive  Preparing Food and eating ? N  Using the Toilet? N  In the past six months, have you accidently leaked urine? N  Do you have problems with loss of bowel control? N  Managing your Medications? Y  Managing your Finances? Y  Comment grandson helps  Housekeeping or managing your Housekeeping? Y    Patient Care Team: Earnestine Mealing, MD as PCP - General (Family Medicine) Sharon Seller, NP as Nurse Practitioner (Geriatric Medicine)  Indicate any recent Medical Services you may have received from other than [facility] providers in the past year (date may be approximate).     Assessment:   This is a routine wellness examination for Bridget Nolan.  Hearing/Vision screen No results found.  Dietary issues and exercise activities discussed: Current Exercise Habits: Structured exercise class, Type of exercise: calisthenics, Time (Minutes): 30, Frequency (Times/Week): 5, Weekly Exercise (Minutes/Week): 150   Goals Addressed   None    Depression Screen    05/18/2019   10:00 AM  PHQ 2/9 Scores  PHQ - 2 Score 0    Fall Risk    05/18/2019   10:00 AM 05/11/2019    3:49 PM 04/20/2019    9:01 AM 02/21/2019    2:39 PM  Fall Risk   Falls in the past year? 0 1 0 0  Number falls in past yr: 0 0  0  Injury with Fall? 0 1 0 0    FALL RISK PREVENTION PERTAINING TO THE HOME:  Any stairs in or around the home? No  If so, are there any without handrails? na Home free of loose throw rugs in walkways, pet beds, electrical cords, etc? Yes  Adequate lighting in your home to reduce risk of falls? Yes    ASSISTIVE DEVICES UTILIZED TO PREVENT FALLS:  Life alert? Yes  Use of a cane, walker or w/c? Yes  Grab bars in the bathroom? Yes  Shower chair or bench in shower? Yes  Elevated toilet seat or a handicapped toilet? Yes   TIMED UP AND GO:  Was the test performed? No .    Cognitive Function:    05/18/2019   10:01 AM  MMSE - Mini Mental State Exam  Orientation to time 5  Orientation to Place 5  Registration 3  Attention/ Calculation 4  Recall 0  Language- name 2 objects 2  Language- repeat 0  Language- follow 3 step command 3  Language- read & follow direction 1  Write a sentence 1  Copy design 0  Total score 24        Immunizations Immunization History  Administered Date(s) Administered   Influenza-Unspecified 11/21/2019, 11/19/2020, 11/19/2021   Moderna Sars-Covid-2 Vaccination 02/16/2019, 03/16/2019, 12/12/2021, 05/12/2022   Pneumococcal-Unspecified 09/04/1991   Unspecified SARS-COV-2 Vaccination 12/15/2019, 06/21/2020, 10/25/2020, 07/01/2021    TDAP status: Due, Education has been provided regarding the importance of this vaccine. Advised may receive this vaccine at local pharmacy or Health Dept. Aware to provide a copy of the vaccination record if obtained from local pharmacy or Health Dept. Verbalized acceptance and understanding.  Flu Vaccine status: Up to date  Pneumococcal vaccine status: Due, Education has been provided regarding the importance of this vaccine. Advised may receive this vaccine at local pharmacy or Health Dept. Aware to provide a copy of the vaccination record if obtained from local pharmacy or Health Dept. Verbalized acceptance and understanding.  Covid-19 vaccine status: Information provided on how to obtain vaccines.   Qualifies for Shingles Vaccine? Yes   Zostavax completed No   Shingrix Completed?: No.  Education has been provided regarding the importance of this vaccine. Patient has been advised to call insurance company to  determine out of pocket expense if they have not yet received this vaccine. Advised may also receive vaccine at local pharmacy or Health Dept. Verbalized acceptance and understanding.  Screening Tests Health Maintenance  Topic Date Due   DTaP/Tdap/Td (1 - Tdap) Never done   Zoster Vaccines- Shingrix (1 of 2) Never done   Pneumonia Vaccine 2+ Years old (1 of 1 - PCV) 09/03/1988   DEXA SCAN  Never done   COVID-19 Vaccine (9 - 2023-24 season) 07/07/2022   INFLUENZA VACCINE  09/03/2022   Medicare Annual Wellness (AWV)  07/01/2023   HPV VACCINES  Aged Out    Health Maintenance  Health Maintenance Due  Topic Date Due   DTaP/Tdap/Td (1 - Tdap) Never done   Zoster Vaccines- Shingrix (1 of 2) Never done   Pneumonia Vaccine 31+ Years old (1 of 1 - PCV) 09/03/1988   DEXA SCAN  Never done    Colorectal cancer screening: No longer required.   Mammogram status: No longer required due to age.   Lung Cancer Screening: (Low Dose CT Chest recommended if Age 50-80 years, 30 pack-year currently smoking OR have quit w/in 15years.) does not qualify.   Lung Cancer Screening Referral: na  Additional Screening:  Hepatitis C Screening: does not qualify; Completed   Vision Screening: Recommended annual ophthalmology exams for early detection of glaucoma and other disorders of the eye. Is the patient up to date with their annual eye exam?  Yes  Who is the provider or what is the name of the office in which the patient attends annual eye exams? [facility] If pt is not established with a provider, would they like to be referred to a provider to establish care? No .   Dental Screening: Recommended annual dental exams for proper oral hygiene  Community Resource Referral / Chronic Care Management: CRR required this visit?  No   CCM required this visit?  No      Plan:     I have personally reviewed and noted the following in the patient's chart:   Medical and social history Use of alcohol,  tobacco or illicit drugs  Current medications and supplements including opioid prescriptions. Patient is not currently taking opioid prescriptions. Functional ability and status Nutritional status Physical activity Advanced directives List of other physicians Hospitalizations, surgeries, and ER visits in previous 12 months Vitals Screenings to include cognitive, depression, and falls Referrals and appointments  In addition, I have reviewed and discussed with patient certain preventive protocols, quality metrics, and best practice recommendations. A written personalized care plan for preventive services as well as general preventive health recommendations were provided to patient.     Sharon Seller, NP   07/01/2022   Place of service: [facility]

## 2022-08-12 ENCOUNTER — Encounter: Payer: Self-pay | Admitting: Student

## 2022-08-12 ENCOUNTER — Non-Acute Institutional Stay: Payer: Medicare Other | Admitting: Student

## 2022-08-12 DIAGNOSIS — M810 Age-related osteoporosis without current pathological fracture: Secondary | ICD-10-CM | POA: Diagnosis not present

## 2022-08-12 DIAGNOSIS — I4811 Longstanding persistent atrial fibrillation: Secondary | ICD-10-CM

## 2022-08-12 DIAGNOSIS — N1832 Chronic kidney disease, stage 3b: Secondary | ICD-10-CM

## 2022-08-12 DIAGNOSIS — F0150 Vascular dementia without behavioral disturbance: Secondary | ICD-10-CM

## 2022-08-12 DIAGNOSIS — I350 Nonrheumatic aortic (valve) stenosis: Secondary | ICD-10-CM

## 2022-08-12 DIAGNOSIS — I10 Essential (primary) hypertension: Secondary | ICD-10-CM

## 2022-08-12 NOTE — Progress Notes (Unsigned)
Location:  Other [facility].  Nursing Home Room Number: [facility] 324M Place of Service:  [facility] [phone]) Provider:  Earnestine Mealing, MD  Patient Care Team: Earnestine Mealing, MD as PCP - General (Family Medicine) Sharon Seller, NP as Nurse Practitioner (Geriatric Medicine)  Extended Emergency Contact Information Primary Emergency Contact: Gwynneth Aliment Address: [place] [address] [place] Home Phone: [phone] Mobile Phone: [phone] Relation: Daughter Secondary Emergency Contact: Theodosia Blender [place] Home Phone: [phone] Mobile Phone: [phone] Relation: Daughter  Code Status:  DNR Goals of care: Advanced Directive information    08/12/2022    9:44 AM  Advanced Directives  Does Patient Have a Medical Advance Directive? Yes  Type of Advance Directive Out of facility DNR (pink MOST or yellow form)  Does patient want to make changes to medical advance directive? No - Patient declined     Chief Complaint  Patient presents with   Medical Management of Chronic Issues    Medical Management of Chronic Issues.     HPI:  Pt is a 89 y.o. female seen today for medical management of chronic diseases.    Patient is doing well. Has no concerns. Knows she is at [facility], uncertain of year or month. She knos her birhtday, but didn't know it was 1 mo away. Says her grandson may help celebrate, but they aren't big into the celebrations. Tells stories of her father in the [employer] then working at [employer]. She talked about soldiers taking her to school during the war. Denies chest pain or shortness of breath beyond her baseline. Eating well. No concerns with constipation or urination.   Nursing without concerns at this time.   Past Medical History:  Diagnosis Date   Cancer (HCC) skin   Cataract    Hearing loss    Osteoporosis    Vascular dementia [facility])    Past Surgical History:  Procedure Laterality Date    BREAST BIOPSY Left neg   BREAST CYST ASPIRATION Bilateral    EYE SURGERY Bilateral    HERNIA REPAIR     INTRAMEDULLARY (IM) NAIL INTERTROCHANTERIC Left 02/02/2015   Procedure: INTRAMEDULLARY (IM) NAIL INTERTROCHANTRIC;  Surgeon: Danelle Earthly, MD;  Location: [facility] ORS;  Service: Orthopedics;  Laterality: Left;    Allergies  Allergen Reactions   Simvastatin     Other reaction(s): Muscle Pain   Levofloxacin Rash   Penicillins Rash    Has patient had a PCN reaction causing immediate rash, facial/tongue/throat swelling, SOB or lightheadedness with hypotension: Yes Has patient had a PCN reaction causing severe rash involving mucus membranes or skin necrosis: No Has patient had a PCN reaction that required hospitalization Yes Has patient had a PCN reaction occurring within the last 10 years: No If all of the above answers are "NO", then may proceed with Cephalosporin use.    Outpatient Encounter Medications as of 08/12/2022  Medication Sig   acetaminophen (TYLENOL) 325 MG tablet Take 650 mg by mouth every 4 (four) hours as needed.   acetaminophen (TYLENOL) 650 MG CR tablet Take 650 mg by mouth in the morning and at bedtime.   alum & mag hydroxide-simeth (MAALOX/MYLANTA) 200-200-20 MG/5ML suspension Give 2 Tbsp by mouth every 4 hours as needed for gas, indigestion, or upset stomach supervised self-administration Notify MD if no relief.   aspirin EC 81 MG tablet Take 81 mg by mouth daily. Swallow whole.   BISMUTH SUBSALICYLATE PO Take 10  mLs by mouth as needed.   Carbamide Peroxide (DEBROX OT) Place 5 drops in ear(s) as needed.   cetirizine (ZYRTEC) 10 MG tablet Take 10 mg by mouth as needed for allergies.   cholecalciferol (VITAMIN D3) 25 MCG (1000 UNIT) tablet Take 1,000 Units by mouth daily.   cyanocobalamin (VITAMIN B12) 500 MCG tablet Take 500 mcg by mouth daily.   dextromethorphan-guaiFENesin (ROBITUSSIN-DM) 10-100 MG/5ML liquid Give 2 tsp by mouth every 4 hours as needed for  coughing. supervised self-administration Notify MD if no relief in 3 days or continued use   furosemide (LASIX) 40 MG tablet Take 40 mg by mouth daily.   Glucose 15 GM/32ML GEL Take 1 packet by mouth as needed.   ipratropium-albuterol (DUONEB) 0.5-2.5 (3) MG/3ML SOLN Take 3 mLs by nebulization every 6 (six) hours as needed.   lisinopril (ZESTRIL) 5 MG tablet Take 5 mg by mouth daily.   magnesium hydroxide (MILK OF MAGNESIA) 400 MG/5ML suspension Give 2 Tbsp by mouth as needed for constipation. supervised self-administration daily  Call MD if no relief in 3 days of continued use.   metoprolol succinate (TOPROL-XL) 25 MG 24 hr tablet Take 25 mg by mouth daily.   nystatin powder Apply 1 Application topically as needed.   ondansetron (ZOFRAN) 4 MG tablet Take 4 mg by mouth as needed for nausea or vomiting. Up to 3 times a day   OXYGEN 2lpm via n/c for dyspnea or SOB.   polyethylene glycol (MIRALAX / GLYCOLAX) 17 g packet Take 17 g by mouth daily.   senna (SENOKOT) 8.6 MG tablet Take 1 tablet by mouth daily.   No facility-administered encounter medications on file as of 08/12/2022.    Review of Systems  Immunization History  Administered Date(s) Administered   Covid-19, Mrna,Vaccine(Spikevax)29yrs and older 05/12/2022   Influenza-Unspecified 11/21/2019, 11/19/2020, 11/19/2021   Moderna Sars-Covid-2 Vaccination 02/16/2019, 03/16/2019, 12/15/2019, 12/12/2021, 05/12/2022   Pneumococcal-Unspecified 09/04/1991   Unspecified SARS-COV-2 Vaccination 12/15/2019, 06/21/2020, 10/25/2020, 07/01/2021   Pertinent  Health Maintenance Due  Topic Date Due   DEXA SCAN  Never done   INFLUENZA VACCINE  09/03/2022      05/11/2019    3:49 PM 05/18/2019   10:00 AM 08/07/2020    3:35 PM 10/18/2020    3:41 PM 10/19/2020   12:49 PM  Fall Risk  Falls in the past year? 1 0     Was there an injury with Fall? 1 0     Fall Risk Category Calculator 2 0     Fall Risk Category (Retired) Moderate Low     (RETIRED)  Patient Fall Risk Level Moderate fall risk Low fall risk High fall risk Moderate fall risk Moderate fall risk   Functional Status Survey:    Vitals:   08/12/22 0934  BP: 107/67  Pulse: 78  Resp: 19  Temp: 97.8 F (36.6 C)  SpO2: 97%  Weight: 142 lb 6.4 oz (64.6 kg)  Height: 5\' 3"  (1.6 m)   Body mass index is 25.23 kg/m. Physical Exam  Labs reviewed: Recent Labs    02/19/22 0000 03/05/22 0000 05/21/22 0000  NA 137 138 137  K 4.5 4.6 4.7  CL 106 102 105  CO2 26* 28* 25*  BUN 25* 31* 42*  CREATININE 1.0 1.1 1.5*  CALCIUM 9.2 9.6 9.6   Recent Labs    12/08/21 0000 05/21/22 0000  AST 11* 15  ALT 11 10  ALKPHOS 67 80  ALBUMIN 3.9 3.9   Recent Labs  12/08/21 0000 02/19/22 0000 05/21/22 0000  WBC 6.5 4.9 4.7  NEUTROABS 4,160.00 3,048.00 2,900.00  HGB 13.1 12.9 13.2  HCT 39 39 40  PLT 238 184 199   No results found for: "TSH" Lab Results  Component Value Date   HGBA1C 5.5 02/03/2015   No results found for: "CHOL", "HDL", "LDLCALC", "LDLDIRECT", "TRIG", "CHOLHDL"  Significant Diagnostic Results in last 30 days:  No results found.  Assessment/Plan Longstanding persistent atrial fibrillation (HCC)  Stage 3b chronic kidney disease (HCC)  Vascular dementia without behavioral disturbance, psychotic disturbance, mood disturbance, or anxiety, unspecified dementia severity (HCC)  Age-related osteoporosis without current pathological fracture  Severe aortic stenosis  Primary hypertension Patient's HR stable at this time, continue metoprolol and ASA. No signs of bleeding at this time. Aortic stenosis continue lasix. Stable functional status at this time. BP well-controlled, consider dose reduction if persistently less than 120/70. ACEi with renal protective benefits as well. Ctm. Continue Vitamin D and calcium supplementation.   Family/ staff Communication: Nursing  Labs/tests ordered:  q88mo BMP, CBC, annual lipid panel   I spent greater than 30 minutes  for the care of this patient in face to face time, chart review, clinical documentation, patient education.

## 2022-08-13 ENCOUNTER — Encounter: Payer: Self-pay | Admitting: Student

## 2022-08-13 DIAGNOSIS — I4811 Longstanding persistent atrial fibrillation: Secondary | ICD-10-CM | POA: Insufficient documentation

## 2022-11-10 ENCOUNTER — Encounter: Payer: Self-pay | Admitting: Nurse Practitioner

## 2022-11-10 NOTE — Progress Notes (Signed)
This encounter was created in error - please disregard.

## 2022-11-10 NOTE — Progress Notes (Deleted)
Location:  Other [facility]) Nursing Home Room Number: 49 S Place of Service:  ALF (13)  Takoya Jonas K. Janyth Contes, NP   Patient Care Team: Earnestine Mealing, MD as PCP - General (Family Medicine) Sharon Seller, NP as Nurse Practitioner (Geriatric Medicine)  Extended Emergency Contact Information Primary Emergency Contact: Gwynneth Aliment Address: [place] [address] [place] Home Phone: [phone] Mobile Phone: [phone] Relation: Daughter Secondary Emergency Contact: Theodosia Blender [place] Home Phone: [phone] Mobile Phone: [phone] Relation: Daughter  Goals of care: Advanced Directive information    11/10/2022    1:23 PM  Advanced Directives  Does Patient Have a Medical Advance Directive? Yes  Type of Advance Directive Out of facility DNR (pink MOST or yellow form)  Does patient want to make changes to medical advance directive? No - Patient declined     Chief Complaint  Patient presents with   Medical Management of Chronic Issues    Routine visit. Discuss need for td/tdap, shingrix, pneumonia vaccine, DEXA, Flu vaccine and covid booster. [facility] verified.     HPI:  Pt is a 89 y.o. female seen today for medical management of chronic disease. ***   Past Medical History:  Diagnosis Date   Cancer (HCC) skin   Cataract    Hearing loss    Osteoporosis    Vascular dementia (HCC)    Past Surgical History:  Procedure Laterality Date   BREAST BIOPSY Left neg   BREAST CYST ASPIRATION Bilateral    EYE SURGERY Bilateral    HERNIA REPAIR     INTRAMEDULLARY (IM) NAIL INTERTROCHANTERIC Left 02/02/2015   Procedure: INTRAMEDULLARY (IM) NAIL INTERTROCHANTRIC;  Surgeon: Danelle Earthly, MD;  Location: [facility] ORS;  Service: Orthopedics;  Laterality: Left;    Allergies  Allergen Reactions   Simvastatin     Other reaction(s): Muscle Pain   Levofloxacin Rash   Penicillins Rash    Has patient had a PCN reaction causing  immediate rash, facial/tongue/throat swelling, SOB or lightheadedness with hypotension: Yes Has patient had a PCN reaction causing severe rash involving mucus membranes or skin necrosis: No Has patient had a PCN reaction that required hospitalization Yes Has patient had a PCN reaction occurring within the last 10 years: No If all of the above answers are "NO", then may proceed with Cephalosporin use.    Outpatient Encounter Medications as of 11/10/2022  Medication Sig   acetaminophen (TYLENOL) 325 MG tablet Take 650 mg by mouth every 4 (four) hours as needed.   acetaminophen (TYLENOL) 650 MG CR tablet Take 650 mg by mouth in the morning and at bedtime.   alum & mag hydroxide-simeth (MAALOX/MYLANTA) 200-200-20 MG/5ML suspension Give 2 Tbsp by mouth every 4 hours as needed for gas, indigestion, or upset stomach supervised self-administration Notify MD if no relief.   aspirin EC 81 MG tablet Take 81 mg by mouth daily. Swallow whole.   BISMUTH SUBSALICYLATE PO Take 10 mLs by mouth as needed.   Carbamide Peroxide (DEBROX OT) Place 5 drops in ear(s) as needed.   cetirizine (ZYRTEC) 10 MG tablet Take 10 mg by mouth as needed for allergies.   cholecalciferol (VITAMIN D3) 25 MCG (1000 UNIT) tablet Take 1,000 Units by mouth daily.   cyanocobalamin (VITAMIN B12) 500 MCG tablet Take 500 mcg by mouth daily.   dextromethorphan-guaiFENesin (ROBITUSSIN-DM) 10-100 MG/5ML liquid Give 2 tsp by mouth every 4 hours as needed for coughing. supervised self-administration Notify MD  if no relief in 3 days or continued use   furosemide (LASIX) 40 MG tablet Take 40 mg by mouth daily.   Glucose 15 GM/32ML GEL Take 1 packet by mouth as needed.   ipratropium-albuterol (DUONEB) 0.5-2.5 (3) MG/3ML SOLN Take 3 mLs by nebulization every 6 (six) hours as needed.   lisinopril (ZESTRIL) 5 MG tablet Take 5 mg by mouth daily.   magnesium hydroxide (MILK OF MAGNESIA) 400 MG/5ML suspension Give 2 Tbsp by mouth as needed  for constipation. supervised self-administration daily  Call MD if no relief in 3 days of continued use.   metoprolol succinate (TOPROL-XL) 25 MG 24 hr tablet Take 25 mg by mouth daily.   nystatin powder Apply 1 Application topically 2 (two) times daily.   ondansetron (ZOFRAN) 4 MG tablet Take 4 mg by mouth as needed for nausea or vomiting. Up to 3 times a day   OXYGEN 2lpm via n/c for dyspnea or SOB.   polyethylene glycol (MIRALAX / GLYCOLAX) 17 g packet Take 17 g by mouth daily.   senna (SENOKOT) 8.6 MG tablet Take 1 tablet by mouth daily.   No facility-administered encounter medications on file as of 11/10/2022.    Review of Systems ***  Immunization History  Administered Date(s) Administered   Influenza-Unspecified 11/21/2019, 11/19/2020, 11/19/2021   Moderna Covid-19 Fall Seasonal Vaccine 76yrs & older 05/12/2022   Moderna Sars-Covid-2 Vaccination 02/16/2019, 03/16/2019, 12/15/2019, 12/12/2021, 05/12/2022   Pneumococcal-Unspecified 09/04/1991   Unspecified SARS-COV-2 Vaccination 12/15/2019, 06/21/2020, 10/25/2020, 07/01/2021   Pertinent  Health Maintenance Due  Topic Date Due   DEXA SCAN  Never done   INFLUENZA VACCINE  09/03/2022      05/11/2019    3:49 PM 05/18/2019   10:00 AM 08/07/2020    3:35 PM 10/18/2020    3:41 PM 10/19/2020   12:49 PM  Fall Risk  Falls in the past year? 1 0     Was there an injury with Fall? 1 0     Fall Risk Category Calculator 2 0     Fall Risk Category (Retired) Moderate Low     (RETIRED) Patient Fall Risk Level Moderate fall risk Low fall risk High fall risk Moderate fall risk Moderate fall risk   Functional Status Survey:    Vitals:   11/10/22 1320  BP: 112/75  Pulse: 80  Temp: 97.6 F (36.4 C)  SpO2: 94%  Weight: 143 lb 12.8 oz (65.2 kg)  Height: 5\' 3"  (1.6 m)   Body mass index is 25.47 kg/m. Physical Exam***  Labs reviewed: Recent Labs    02/19/22 0000 03/05/22 0000 05/21/22 0000  NA 137 138 137  K 4.5 4.6 4.7  CL 106  102 105  CO2 26* 28* 25*  BUN 25* 31* 42*  CREATININE 1.0 1.1 1.5*  CALCIUM 9.2 9.6 9.6   Recent Labs    12/08/21 0000 05/21/22 0000  AST 11* 15  ALT 11 10  ALKPHOS 67 80  ALBUMIN 3.9 3.9   Recent Labs    12/08/21 0000 02/19/22 0000 05/21/22 0000  WBC 6.5 4.9 4.7  NEUTROABS 4,160.00 3,048.00 2,900.00  HGB 13.1 12.9 13.2  HCT 39 39 40  PLT 238 184 199   No results found for: "TSH" Lab Results  Component Value Date   HGBA1C 5.5 02/03/2015   No results found for: "CHOL", "HDL", "LDLCALC", "LDLDIRECT", "TRIG", "CHOLHDL"  Significant Diagnostic Results in last 30 days:  No results found.  Assessment/Plan There are no diagnoses linked to this  encounter.   Janene Harvey. Biagio Borg [facility] [phone]

## 2022-11-17 ENCOUNTER — Encounter: Payer: Self-pay | Admitting: Nurse Practitioner

## 2022-11-17 ENCOUNTER — Non-Acute Institutional Stay: Payer: Medicare Other | Admitting: Nurse Practitioner

## 2022-11-17 DIAGNOSIS — I10 Essential (primary) hypertension: Secondary | ICD-10-CM

## 2022-11-17 DIAGNOSIS — M810 Age-related osteoporosis without current pathological fracture: Secondary | ICD-10-CM

## 2022-11-17 DIAGNOSIS — K5900 Constipation, unspecified: Secondary | ICD-10-CM

## 2022-11-17 DIAGNOSIS — F0150 Vascular dementia without behavioral disturbance: Secondary | ICD-10-CM

## 2022-11-17 DIAGNOSIS — I4811 Longstanding persistent atrial fibrillation: Secondary | ICD-10-CM

## 2022-11-17 DIAGNOSIS — N1832 Chronic kidney disease, stage 3b: Secondary | ICD-10-CM | POA: Diagnosis not present

## 2022-11-17 DIAGNOSIS — I350 Nonrheumatic aortic (valve) stenosis: Secondary | ICD-10-CM

## 2022-11-17 NOTE — Progress Notes (Unsigned)
Careteam: Patient Care Team: Earnestine Mealing, MD as PCP - General (Family Medicine) Sharon Seller, NP as Nurse Practitioner (Geriatric Medicine)  PLACE OF SERVICE:  [facility].  Advanced Directive information Does Patient Have a Medical Advance Directive?: Yes, Type of Advance Directive: Out of facility DNR (pink MOST or yellow form), Does patient want to make changes to medical advance directive?: No - Patient declined  Allergies  Allergen Reactions   Simvastatin     Other reaction(s): Muscle Pain   Levofloxacin Rash   Penicillins Rash    Has patient had a PCN reaction causing immediate rash, facial/tongue/throat swelling, SOB or lightheadedness with hypotension: Yes Has patient had a PCN reaction causing severe rash involving mucus membranes or skin necrosis: No Has patient had a PCN reaction that required hospitalization Yes Has patient had a PCN reaction occurring within the last 10 years: No If all of the above answers are "NO", then may proceed with Cephalosporin use.    Chief Complaint  Patient presents with   Medical Management of Chronic Issues    Medical Management of Chronic Issues.      HPI: Patient is a 89 y.o. female is seen for medical management of chronic disease.  Seen at assisted living at [facility].  PMH A. Fib, HTN, severe aortic stenosis, CKD3b, dementia, and osteoporosis.  States she is doing well.  Reports some shortness of breath, unsure when it started, she feels like this all the time. Denies chest pain, palpitations, cough. States she is still able to walk often and be active. Per nursing staff, she walks around the pond (~1/4 mile). Uses a walker, denies falls.  Eats 3 meals per day. Tries to drink plenty of water. Reports having infrequent bowel movements but requests medication for constipation when she needs it.  Doesn't nap so she can sleep good at night.  States her memory is not too good, she forgets a lot. She  doesn't know how old she is or what year it is.   No current concerns.   Review of Systems:  Review of Systems  Constitutional:  Negative for chills, fever and weight loss.  Respiratory:  Positive for shortness of breath. Negative for cough and sputum production.   Cardiovascular:  Negative for chest pain, palpitations and leg swelling.  Gastrointestinal:  Positive for constipation. Negative for diarrhea, nausea and vomiting.  Genitourinary:  Negative for dysuria, frequency and urgency.  Musculoskeletal:  Negative for back pain and myalgias.  Neurological:  Negative for dizziness, weakness and headaches.  Psychiatric/Behavioral:  Positive for memory loss. Negative for depression. The patient is not nervous/anxious and does not have insomnia.     Past Medical History:  Diagnosis Date   Cancer (HCC) skin   Cataract    Hearing loss    Osteoporosis    Vascular dementia [facility])    Past Surgical History:  Procedure Laterality Date   BREAST BIOPSY Left neg   BREAST CYST ASPIRATION Bilateral    EYE SURGERY Bilateral    HERNIA REPAIR     INTRAMEDULLARY (IM) NAIL INTERTROCHANTERIC Left 02/02/2015   Procedure: INTRAMEDULLARY (IM) NAIL INTERTROCHANTRIC;  Surgeon: Danelle Earthly, MD;  Location: [facility] ORS;  Service: Orthopedics;  Laterality: Left;   Social History:   reports that she quit smoking about 79 years ago. Her smoking use included cigarettes. She has never used smokeless tobacco. She reports current alcohol use. She reports that she does not use drugs.  Family History  Problem  Relation Age of Onset   Thyroid cancer Mother    Kidney disease Brother    Diabetes Neg Hx     Medications: Patient's Medications  New Prescriptions   No medications on file  Previous Medications   ACETAMINOPHEN (TYLENOL) 325 MG TABLET    Take 650 mg by mouth every 4 (four) hours as needed.   ACETAMINOPHEN (TYLENOL) 650 MG CR TABLET    Take 650 mg by mouth in the morning and at bedtime.   ALUM & MAG  HYDROXIDE-SIMETH (MAALOX/MYLANTA) 200-200-20 MG/5ML SUSPENSION    Give 2 Tbsp by mouth every 4 hours as needed for gas, indigestion, or upset stomach supervised self-administration Notify MD if no relief.   ASPIRIN EC 81 MG TABLET    Take 81 mg by mouth daily. Swallow whole.   BISMUTH SUBSALICYLATE PO    Take 10 mLs by mouth as needed.   CARBAMIDE PEROXIDE (DEBROX OT)    Place 5 drops in ear(s) as needed.   CETIRIZINE (ZYRTEC) 10 MG TABLET    Take 10 mg by mouth as needed for allergies.   CHOLECALCIFEROL (VITAMIN D3) 25 MCG (1000 UNIT) TABLET    Take 1,000 Units by mouth daily.   CYANOCOBALAMIN (VITAMIN B12) 500 MCG TABLET    Take 500 mcg by mouth daily.   DEXTROMETHORPHAN-GUAIFENESIN (ROBITUSSIN-DM) 10-100 MG/5ML LIQUID    Give 2 tsp by mouth every 4 hours as needed for coughing. supervised self-administration Notify MD if no relief in 3 days or continued use   FUROSEMIDE (LASIX) 40 MG TABLET    Take 40 mg by mouth daily.   GLUCOSE 15 GM/32ML GEL    Take 1 packet by mouth as needed.   IPRATROPIUM-ALBUTEROL (DUONEB) 0.5-2.5 (3) MG/3ML SOLN    Take 3 mLs by nebulization every 6 (six) hours as needed.   LISINOPRIL (ZESTRIL) 5 MG TABLET    Take 5 mg by mouth daily.   MAGNESIUM HYDROXIDE (MILK OF MAGNESIA) 400 MG/5ML SUSPENSION    Give 2 Tbsp by mouth as needed for constipation. supervised self-administration daily  Call MD if no relief in 3 days of continued use.   METOPROLOL SUCCINATE (TOPROL-XL) 25 MG 24 HR TABLET    Take 25 mg by mouth daily.   NYSTATIN POWDER    Apply 1 Application topically 2 (two) times daily.   ONDANSETRON (ZOFRAN) 4 MG TABLET    Take 4 mg by mouth as needed for nausea or vomiting. Up to 3 times a day   OXYGEN    2lpm via n/c for dyspnea or SOB.   POLYETHYLENE GLYCOL (MIRALAX / GLYCOLAX) 17 G PACKET    Take 17 g by mouth daily.   SENNA (SENOKOT) 8.6 MG TABLET    Take 1 tablet by mouth daily.  Modified Medications   No medications on file  Discontinued Medications   No  medications on file    Physical Exam:  Vitals:   11/17/22 1455  BP: 112/75  Pulse: 80  Resp: 18  Temp: 97.6 F (36.4 C)  SpO2: 94%  Weight: 143 lb 12.8 oz (65.2 kg)  Height: 5\' 3"  (1.6 m)   Body mass index is 25.47 kg/m. Wt Readings from Last 3 Encounters:  11/17/22 143 lb 12.8 oz (65.2 kg)  11/10/22 143 lb 12.8 oz (65.2 kg)  08/12/22 142 lb 6.4 oz (64.6 kg)    Physical Exam HENT:     Head: Normocephalic and atraumatic.  Cardiovascular:     Rate and Rhythm: Normal rate.  Rhythm irregular.     Pulses: Normal pulses.     Heart sounds: Normal heart sounds.  Pulmonary:     Breath sounds: Normal breath sounds.  Abdominal:     General: Bowel sounds are normal.     Palpations: Abdomen is soft.  Musculoskeletal:     Right lower leg: Edema present.     Left lower leg: Edema present.  Skin:    General: Skin is warm and dry.  Neurological:     Mental Status: She is alert. Mental status is at baseline. She is disoriented.  Psychiatric:        Mood and Affect: Mood normal.     Labs reviewed: Basic Metabolic Panel: Recent Labs    02/19/22 0000 03/05/22 0000 05/21/22 0000  NA 137 138 137  K 4.5 4.6 4.7  CL 106 102 105  CO2 26* 28* 25*  BUN 25* 31* 42*  CREATININE 1.0 1.1 1.5*  CALCIUM 9.2 9.6 9.6   Liver Function Tests: Recent Labs    12/08/21 0000 05/21/22 0000  AST 11* 15  ALT 11 10  ALKPHOS 67 80  ALBUMIN 3.9 3.9   No results for input(s): "LIPASE", "AMYLASE" in the last 8760 hours. No results for input(s): "AMMONIA" in the last 8760 hours. CBC: Recent Labs    12/08/21 0000 02/19/22 0000 05/21/22 0000  WBC 6.5 4.9 4.7  NEUTROABS 4,160.00 3,048.00 2,900.00  HGB 13.1 12.9 13.2  HCT 39 39 40  PLT 238 184 199   Lipid Panel: No results for input(s): "CHOL", "HDL", "LDLCALC", "TRIG", "CHOLHDL", "LDLDIRECT" in the last 8760 hours. TSH: No results for input(s): "TSH" in the last 8760 hours. A1C: Lab Results  Component Value Date   HGBA1C 5.5  02/03/2015     Assessment/Plan 1. Primary hypertension -Controlled, at goal, <140/90 -Continue lisinopril -Encouraged dietary modifications/DASH diet and physical activity as tolerated   2. Severe aortic stenosis -Continue lasix, ongoing shortness of breath but at baseline, denies activity intolerance  3. Stage 3b chronic kidney disease (HCC) -Chronic and stable -Avoid nephrotoxic medications -Encouraged proper hydration and nutrition  4. Longstanding persistent atrial fibrillation (HCC) -Rate controlled on metoprolol succinate and aspirin, no signs of bleeding  5. Age-related osteoporosis without current pathological fracture -Continue Vit. D/calcium supplementation -Encouraged weight bearing exercise as tolerated  6. Vascular dementia without behavioral disturbance, psychotic disturbance, mood disturbance, or anxiety, unspecified dementia severity (HCC) -Stable, no acute changes in mood, memory, or functional status -Continue supportive care, remains in assisted living  7. Constipation, unspecified constipation type -Continue scheduled miralax, senna; PRN milk of magnesia -Encouraged dietary fiber intake, adequate nutrition, and physical activity as tolerated  Rollen Sox, FNP-MSN Student -I personally was present during the history, physical exam and medical decision-making activities of this service and have verified that the service and findings are accurately documented in the student's note Abbey Chatters, NP

## 2022-11-17 NOTE — Progress Notes (Unsigned)
error 

## 2022-11-19 LAB — HEPATIC FUNCTION PANEL
ALT: 9 U/L (ref 7–35)
AST: 12 — AB (ref 13–35)
Alkaline Phosphatase: 90 (ref 25–125)
Bilirubin, Total: 0.7

## 2022-11-19 LAB — CBC AND DIFFERENTIAL
HCT: 40 (ref 36–46)
Hemoglobin: 13.2 (ref 12.0–16.0)
Neutrophils Absolute: 3315
Platelets: 197 10*3/uL (ref 150–400)
WBC: 5.6

## 2022-11-19 LAB — COMPREHENSIVE METABOLIC PANEL
Albumin: 3.7 (ref 3.5–5.0)
Calcium: 9.3 (ref 8.7–10.7)
Globulin: 2.1
eGFR: 34

## 2022-11-19 LAB — CBC: RBC: 3.88 (ref 3.87–5.11)

## 2022-11-19 LAB — BASIC METABOLIC PANEL
BUN: 37 — AB (ref 4–21)
CO2: 25 — AB (ref 13–22)
Chloride: 106 (ref 99–108)
Creatinine: 1.4 — AB (ref 0.5–1.1)
Glucose: 78
Potassium: 5.1 meq/L (ref 3.5–5.1)
Sodium: 139 (ref 137–147)

## 2023-03-01 ENCOUNTER — Non-Acute Institutional Stay: Payer: Medicare Other | Admitting: Student

## 2023-03-01 DIAGNOSIS — F0150 Vascular dementia without behavioral disturbance: Secondary | ICD-10-CM | POA: Diagnosis not present

## 2023-03-01 DIAGNOSIS — N1832 Chronic kidney disease, stage 3b: Secondary | ICD-10-CM | POA: Diagnosis not present

## 2023-03-01 DIAGNOSIS — I350 Nonrheumatic aortic (valve) stenosis: Secondary | ICD-10-CM | POA: Diagnosis not present

## 2023-03-01 DIAGNOSIS — I4811 Longstanding persistent atrial fibrillation: Secondary | ICD-10-CM | POA: Diagnosis not present

## 2023-03-01 DIAGNOSIS — I10 Essential (primary) hypertension: Secondary | ICD-10-CM

## 2023-03-05 ENCOUNTER — Encounter: Payer: Self-pay | Admitting: Student

## 2023-03-05 NOTE — Progress Notes (Signed)
Location:  Other   Place of Service:  ALF (13) Provider:  Judeth Horn, MD  Patient Care Team: Earnestine Mealing, MD as PCP - General (Family Medicine) Sharon Seller, NP as Nurse Practitioner (Geriatric Medicine)  Extended Emergency Contact Information Primary Emergency Contact: Gwynneth Aliment Address: [place] [address] [place] Home Phone: [phone] Mobile Phone: [phone] Relation: Daughter Secondary Emergency Contact: Theodosia Blender [place] Home Phone: [phone] Mobile Phone: [phone] Relation: Daughter  Code Status:  DNR  Goals of care: Advanced Directive information    11/17/2022    3:00 PM  Advanced Directives  Does Patient Have a Medical Advance Directive? Yes  Type of Advance Directive Out of facility DNR (pink MOST or yellow form)  Does patient want to make changes to medical advance directive? No - Patient declined     Chief Complaint  Patient presents with   Medical Management of Chronic Conditions    HPI:  Pt is a 89 y.o. female seen today for medical management of chronic diseases.   Discussed the use of AI scribe software for clinical note transcription with the patient, who gave verbal consent to proceed.  History of Present Illness        History of Present Illness The patient is a 89 year old female who is seen today for a routine visit. She has a history of dementia and CHF with periodic episodes of shortness of breath.   She experiences frequent confusion, describing it as 'iffy'. She recalls an incident involving confusion about the phones, indicating possible lapses in understanding or memory. She is unsure of the cause of her confusion. Discussed patient's history of dementia.   She notes experiencing shortness of breath. Initially, she denies breathing issues but later acknowledges getting out of breath, which she states may be related to her confusion.  She is  actively involved in various activities, although she no longer swims as much as she used to. She participates in available activities and used to enjoy walking around the block, but she no longer has a specific walking route.    Past Medical History:  Diagnosis Date   Cancer (HCC) skin   Cataract    Hearing loss    Osteoporosis    Vascular dementia [facility])    Past Surgical History:  Procedure Laterality Date   BREAST BIOPSY Left neg   BREAST CYST ASPIRATION Bilateral    EYE SURGERY Bilateral    HERNIA REPAIR     INTRAMEDULLARY (IM) NAIL INTERTROCHANTERIC Left 02/02/2015   Procedure: INTRAMEDULLARY (IM) NAIL INTERTROCHANTRIC;  Surgeon: Danelle Earthly, MD;  Location: [facility] ORS;  Service: Orthopedics;  Laterality: Left;    Allergies  Allergen Reactions   Simvastatin     Other reaction(s): Muscle Pain   Levofloxacin Rash   Penicillins Rash    Has patient had a PCN reaction causing immediate rash, facial/tongue/throat swelling, SOB or lightheadedness with hypotension: Yes Has patient had a PCN reaction causing severe rash involving mucus membranes or skin necrosis: No Has patient had a PCN reaction that required hospitalization Yes Has patient had a PCN reaction occurring within the last 10 years: No If all of the above answers are "NO", then may proceed with Cephalosporin use.    Outpatient Encounter Medications as of 03/01/2023  Medication Sig   acetaminophen (TYLENOL) 325 MG tablet Take 650 mg by mouth every 4 (four) hours as needed.   acetaminophen (  TYLENOL) 650 MG CR tablet Take 650 mg by mouth in the morning and at bedtime.   alum & mag hydroxide-simeth (MAALOX/MYLANTA) 200-200-20 MG/5ML suspension Give 2 Tbsp by mouth every 4 hours as needed for gas, indigestion, or upset stomach supervised self-administration Notify MD if no relief.   aspirin EC 81 MG tablet Take 81 mg by mouth daily. Swallow whole.   BISMUTH SUBSALICYLATE PO Take 10 mLs by mouth as needed.   Carbamide  Peroxide (DEBROX OT) Place 5 drops in ear(s) as needed.   cetirizine (ZYRTEC) 10 MG tablet Take 10 mg by mouth as needed for allergies.   cholecalciferol (VITAMIN D3) 25 MCG (1000 UNIT) tablet Take 1,000 Units by mouth daily.   cyanocobalamin (VITAMIN B12) 500 MCG tablet Take 500 mcg by mouth daily.   dextromethorphan-guaiFENesin (ROBITUSSIN-DM) 10-100 MG/5ML liquid Give 2 tsp by mouth every 4 hours as needed for coughing. supervised self-administration Notify MD if no relief in 3 days or continued use   furosemide (LASIX) 40 MG tablet Take 40 mg by mouth daily.   Glucose 15 GM/32ML GEL Take 1 packet by mouth as needed.   ipratropium-albuterol (DUONEB) 0.5-2.5 (3) MG/3ML SOLN Take 3 mLs by nebulization every 6 (six) hours as needed.   lisinopril (ZESTRIL) 5 MG tablet Take 5 mg by mouth daily.   magnesium hydroxide (MILK OF MAGNESIA) 400 MG/5ML suspension Give 2 Tbsp by mouth as needed for constipation. supervised self-administration daily  Call MD if no relief in 3 days of continued use.   metoprolol succinate (TOPROL-XL) 25 MG 24 hr tablet Take 25 mg by mouth daily.   nystatin powder Apply 1 Application topically 2 (two) times daily.   ondansetron (ZOFRAN) 4 MG tablet Take 4 mg by mouth as needed for nausea or vomiting. Up to 3 times a day   OXYGEN 2lpm via n/c for dyspnea or SOB.   polyethylene glycol (MIRALAX / GLYCOLAX) 17 g packet Take 17 g by mouth daily.   senna (SENOKOT) 8.6 MG tablet Take 1 tablet by mouth daily.   No facility-administered encounter medications on file as of 03/01/2023.    Review of Systems  Immunization History  Administered Date(s) Administered   Influenza-Unspecified 11/21/2019, 11/19/2020, 11/19/2021   Moderna Covid-19 Fall Seasonal Vaccine 90yrs & older 05/12/2022   Moderna Sars-Covid-2 Vaccination 02/16/2019, 03/16/2019, 12/15/2019, 12/12/2021, 05/12/2022   Pneumococcal-Unspecified 09/04/1991   Unspecified SARS-COV-2 Vaccination 12/15/2019, 06/21/2020,  10/25/2020, 07/01/2021   Pertinent  Health Maintenance Due  Topic Date Due   DEXA SCAN  Never done   INFLUENZA VACCINE  09/03/2022      05/11/2019    3:49 PM 05/18/2019   10:00 AM 08/07/2020    3:35 PM 10/18/2020    3:41 PM 10/19/2020   12:49 PM  Fall Risk  Falls in the past year? 1 0     Was there an injury with Fall? 1 0     Fall Risk Category Calculator 2 0     Fall Risk Category (Retired) Moderate Low     (RETIRED) Patient Fall Risk Level Moderate fall risk Low fall risk High fall risk Moderate fall risk Moderate fall risk   Functional Status Survey:    Vitals:   03/05/23 0534  BP: 112/72  Pulse: 87  Resp: 18  Temp: 97.8 F (36.6 C)  SpO2: 94%  Weight: 145 lb (65.8 kg)   Body mass index is 25.69 kg/m. Physical Exam Physical Exam General: elderly, frail MSK: Kyphosis CV: appears well-perfused, no lower extremity  swelling, normal HR Pulm: Lungs clear to auscultation bilaterally, decreased lung fields.  Abdomen: Soft, non-tender, non-distended.  Neuro: Abnormal gait, using rolling walker  Labs reviewed: Recent Labs    05/21/22 0000  NA 137  K 4.7  CL 105  CO2 25*  BUN 42*  CREATININE 1.5*  CALCIUM 9.6   Recent Labs    05/21/22 0000  AST 15  ALT 10  ALKPHOS 80  ALBUMIN 3.9   Recent Labs    05/21/22 0000  WBC 4.7  NEUTROABS 2,900.00  HGB 13.2  HCT 40  PLT 199   No results found for: "TSH" Lab Results  Component Value Date   HGBA1C 5.5 02/03/2015   No results found for: "CHOL", "HDL", "LDLCALC", "LDLDIRECT", "TRIG", "CHOLHDL"  Significant Diagnostic Results in last 30 days:  No results found.  Assessment/Plan  Cognitive Impairment   A 89 year old individual exhibits progressive cognitive impairment, including confusion about recent events, difficulty recalling personal history, and disorientation regarding time and place. This decline is impacting daily activities and social interactions. She is aware of her confusion and expresses  uncertainty about recent events and personal history.   - Evaluate cognitive function with a standardized assessment tool   - Discuss potential need for further neuropsychological evaluation   - Consider referral to a neurologist for comprehensive assessment   - Discuss cognitive status and potential safety concerns with family members    Severe Aortic Stenosis Patient appears euvolemic on exam, however, has progressive symptoms of dyspnea. Will communicate with family continued goals of care as patient has declined in function over the course of the last year.  - continue current lasix dosing - BMP, Mg  General Health Maintenance   The patient has reduced physical activity due to possible physical limitations but remains socially engaged with a supportive family network.   - Encourage continued participation in social activities   - Recommend finding a safe and accessible walking route to promote physical activity   - Discuss potential benefits of physical therapy to improve mobility and strength   - Ensure regular follow-up visits to monitor overall health and cognitive function    Follow-up   - Schedule follow-up visit in 3 months to reassess cognitive function and overall health   - Coordinate with family members to ensure they are informed about the patient's health status and care plan.

## 2023-03-08 LAB — CBC AND DIFFERENTIAL
HCT: 45 (ref 36–46)
Hemoglobin: 14.5 (ref 12.0–16.0)
Neutrophils Absolute: 3694
Platelets: 215 10*3/uL (ref 150–400)
WBC: 5.3

## 2023-03-08 LAB — BASIC METABOLIC PANEL
BUN: 32 — AB (ref 4–21)
CO2: 29 — AB (ref 13–22)
Chloride: 103 (ref 99–108)
Creatinine: 1.4 — AB (ref 0.5–1.1)
Glucose: 123
Potassium: 4.9 meq/L (ref 3.5–5.1)
Sodium: 136 — AB (ref 137–147)

## 2023-03-08 LAB — COMPREHENSIVE METABOLIC PANEL: Calcium: 9.7 (ref 8.7–10.7)

## 2023-03-08 LAB — CBC: RBC: 4.36 (ref 3.87–5.11)

## 2023-03-08 LAB — TSH: TSH: 2.07 (ref 0.41–5.90)

## 2023-04-07 ENCOUNTER — Encounter: Payer: Self-pay | Admitting: Student

## 2023-04-07 ENCOUNTER — Non-Acute Institutional Stay: Payer: Self-pay | Admitting: Student

## 2023-04-07 DIAGNOSIS — I350 Nonrheumatic aortic (valve) stenosis: Secondary | ICD-10-CM | POA: Diagnosis not present

## 2023-04-07 NOTE — Progress Notes (Signed)
 Location:  Other [facility].  Nursing Home Room Number: [address] Place of Service:  ALF [phone]) Provider:  Earnestine Mealing, MD  Patient Care Team: Earnestine Mealing, MD as PCP - General (Family Medicine) Sharon Seller, NP as Nurse Practitioner (Geriatric Medicine)  Extended Emergency Contact Information Primary Emergency Contact: Gwynneth Aliment Address: [place] [address] [place] Home Phone: [phone] Mobile Phone: [phone] Relation: Daughter Secondary Emergency Contact: Theodosia Blender [place] Home Phone: [phone] Mobile Phone: [phone] Relation: Daughter  Code Status:  DNR Goals of care: Advanced Directive information    04/07/2023    1:22 PM  Advanced Directives  Does Patient Have a Medical Advance Directive? Yes  Type of Advance Directive Out of facility DNR (pink MOST or yellow form)  Does patient want to make changes to medical advance directive? No - Patient declined     Chief Complaint  Patient presents with   Shortness of Breath    SOB    HPI:  Pt is a 89 y.o. female seen today for an acute visit for SOB Patient was in the beauty salon and unable to sit comfortably in chair when leaning back. She has gained weight has bilateral lower extremity swelling. In general she has been increasingly confused for the last few months. Some concern it is acutely worse at this time.   Contacted daughter Eulas Post with concern for patient's decline and that higher level of care may give her the adequate care that she needs at this stage of life. If patient is ill, she states the preference would be comfort measures rather than going back and forth to the hospital.   Past Medical History:  Diagnosis Date   Cancer (HCC) skin   Cataract    Hearing loss    Osteoporosis    Vascular dementia [facility])    Past Surgical History:  Procedure Laterality Date   BREAST BIOPSY Left neg   BREAST CYST  ASPIRATION Bilateral    EYE SURGERY Bilateral    HERNIA REPAIR     INTRAMEDULLARY (IM) NAIL INTERTROCHANTERIC Left 02/02/2015   Procedure: INTRAMEDULLARY (IM) NAIL INTERTROCHANTRIC;  Surgeon: Danelle Earthly, MD;  Location: [facility] ORS;  Service: Orthopedics;  Laterality: Left;    Allergies  Allergen Reactions   Simvastatin     Other reaction(s): Muscle Pain   Levofloxacin Rash   Penicillins Rash    Has patient had a PCN reaction causing immediate rash, facial/tongue/throat swelling, SOB or lightheadedness with hypotension: Yes Has patient had a PCN reaction causing severe rash involving mucus membranes or skin necrosis: No Has patient had a PCN reaction that required hospitalization Yes Has patient had a PCN reaction occurring within the last 10 years: No If all of the above answers are "NO", then may proceed with Cephalosporin use.    Outpatient Encounter Medications as of 04/07/2023  Medication Sig   acetaminophen (TYLENOL) 325 MG tablet Take 650 mg by mouth every 4 (four) hours as needed.   acetaminophen (TYLENOL) 650 MG CR tablet Take 650 mg by mouth in the morning and at bedtime.   alum & mag hydroxide-simeth (MAALOX/MYLANTA) 200-200-20 MG/5ML suspension Give 2 Tbsp by mouth every 4 hours as needed for gas, indigestion, or upset stomach supervised self-administration Notify MD if no relief.   aspirin EC 81 MG tablet Take 81 mg by mouth daily. Swallow whole.   BISMUTH SUBSALICYLATE PO Take 10 mLs by mouth as  needed.   Carbamide Peroxide (DEBROX OT) Place 5 drops in ear(s) as needed.   cetirizine (ZYRTEC) 10 MG tablet Take 10 mg by mouth as needed for allergies.   cholecalciferol (VITAMIN D3) 25 MCG (1000 UNIT) tablet Take 1,000 Units by mouth daily.   cyanocobalamin (VITAMIN B12) 500 MCG tablet Take 500 mcg by mouth daily.   dextromethorphan-guaiFENesin (ROBITUSSIN-DM) 10-100 MG/5ML liquid Give 2 tsp by mouth every 4 hours as needed for coughing. supervised self-administration Notify  MD if no relief in 3 days or continued use   furosemide (LASIX) 40 MG tablet Take 40 mg by mouth daily.   Glucose 15 GM/32ML GEL Take 1 packet by mouth as needed.   ipratropium-albuterol (DUONEB) 0.5-2.5 (3) MG/3ML SOLN Take 3 mLs by nebulization every 6 (six) hours as needed.   lisinopril (ZESTRIL) 5 MG tablet Take 5 mg by mouth daily.   magnesium hydroxide (MILK OF MAGNESIA) 400 MG/5ML suspension Give 2 Tbsp by mouth as needed for constipation. supervised self-administration daily  Call MD if no relief in 3 days of continued use.   metoprolol succinate (TOPROL-XL) 25 MG 24 hr tablet Take 25 mg by mouth daily.   nystatin powder Apply 1 Application topically 2 (two) times daily.   ondansetron (ZOFRAN) 4 MG tablet Take 4 mg by mouth as needed for nausea or vomiting. Up to 3 times a day   OXYGEN 2lpm via n/c for dyspnea or SOB.   polyethylene glycol (MIRALAX / GLYCOLAX) 17 g packet Take 17 g by mouth daily.   senna (SENOKOT) 8.6 MG tablet Take 1 tablet by mouth daily.   No facility-administered encounter medications on file as of 04/07/2023.    Review of Systems  Immunization History  Administered Date(s) Administered   Influenza-Unspecified 11/21/2019, 11/19/2020, 11/19/2021, 11/20/2022   Moderna Covid-19 Fall Seasonal Vaccine 77yrs & older 05/12/2022   Moderna Sars-Covid-2 Vaccination 02/16/2019, 03/16/2019, 12/15/2019, 12/12/2021, 05/12/2022   Pneumococcal-Unspecified 09/04/1991   Unspecified SARS-COV-2 Vaccination 12/15/2019, 06/21/2020, 10/25/2020, 07/01/2021, 12/17/2022   Pertinent  Health Maintenance Due  Topic Date Due   DEXA SCAN  Never done   INFLUENZA VACCINE  Completed      05/11/2019    3:49 PM 05/18/2019   10:00 AM 08/07/2020    3:35 PM 10/18/2020    3:41 PM 10/19/2020   12:49 PM  Fall Risk  Falls in the past year? 1 0     Was there an injury with Fall? 1 0     Fall Risk Category Calculator 2 0     Fall Risk Category (Retired) Moderate Low     (RETIRED) Patient Fall  Risk Level Moderate fall risk Low fall risk High fall risk Moderate fall risk Moderate fall risk   Functional Status Survey:    Vitals:   04/07/23 1245  BP: 115/61  Pulse: 93  Resp: 19  Temp: (!) 97.5 F (36.4 C)  SpO2: (!) 86%  Weight: 147 lb 8 oz (66.9 kg)  Height: 5\' 3"  (1.6 m)   Body mass index is 26.13 kg/m. Physical Exam Pulmonary:     Breath sounds: Examination of the right-upper field reveals wheezing. Examination of the left-upper field reveals wheezing. Examination of the right-middle field reveals wheezing. Examination of the left-middle field reveals wheezing. Examination of the right-lower field reveals wheezing. Examination of the left-lower field reveals wheezing. Wheezing present.  Skin:    General: Skin is warm and dry.  Neurological:     Mental Status: She is alert.  Labs reviewed: Recent Labs    05/21/22 0000 11/19/22 0000 03/08/23 0000  NA 137 139 136*  K 4.7 5.1 4.9  CL 105 106 103  CO2 25* 25* 29*  BUN 42* 37* 32*  CREATININE 1.5* 1.4* 1.4*  CALCIUM 9.6 9.3 9.7   Recent Labs    05/21/22 0000 11/19/22 0000  AST 15 12*  ALT 10 9  ALKPHOS 80 90  ALBUMIN 3.9 3.7   Recent Labs    05/21/22 0000 11/19/22 0000 03/08/23 0000  WBC 4.7 5.6 5.3  NEUTROABS 2,900.00 3,315.00 3,694.00  HGB 13.2 13.2 14.5  HCT 40 40 45  PLT 199 197 215   Lab Results  Component Value Date   TSH 2.07 03/08/2023   Lab Results  Component Value Date   HGBA1C 5.5 02/03/2015   No results found for: "CHOL", "HDL", "LDLCALC", "LDLDIRECT", "TRIG", "CHOLHDL"  Significant Diagnostic Results in last 30 days:  No results found.  Assessment/Plan Severe aortic stenosis High suspicion shortness of breath is due to cardiac function. She isn't asking for the prn medications for weight gain or wheezing, which is concerning that she may require a higher level of care to have adequate health management.  Family/ staff Communication: nursing and daughter.    Labs/tests ordered:  CBC, BMP, BNP   I spent greater than 30 minutes for the care of this patient in face to face time, chart review, clinical documentation, patient education and care coordination.

## 2023-04-08 LAB — BASIC METABOLIC PANEL
BUN: 29 — AB (ref 4–21)
CO2: 27 — AB (ref 13–22)
Chloride: 103 (ref 99–108)
Creatinine: 1.3 — AB (ref 0.5–1.1)
Glucose: 109
Potassium: 4.5 meq/L (ref 3.5–5.1)
Sodium: 139 (ref 137–147)

## 2023-04-08 LAB — CBC AND DIFFERENTIAL
HCT: 40 (ref 36–46)
Hemoglobin: 13.3 (ref 12.0–16.0)
Platelets: 210 10*3/uL (ref 150–400)
WBC: 8.5

## 2023-04-08 LAB — COMPREHENSIVE METABOLIC PANEL
Calcium: 9.3 (ref 8.7–10.7)
eGFR: 38

## 2023-04-08 LAB — CBC: RBC: 3.94 (ref 3.87–5.11)

## 2023-04-09 ENCOUNTER — Non-Acute Institutional Stay (SKILLED_NURSING_FACILITY): Payer: Self-pay | Admitting: Student

## 2023-04-09 ENCOUNTER — Encounter: Payer: Self-pay | Admitting: Student

## 2023-04-09 DIAGNOSIS — F0150 Vascular dementia without behavioral disturbance: Secondary | ICD-10-CM | POA: Diagnosis not present

## 2023-04-09 DIAGNOSIS — I10 Essential (primary) hypertension: Secondary | ICD-10-CM | POA: Diagnosis not present

## 2023-04-09 DIAGNOSIS — I5021 Acute systolic (congestive) heart failure: Secondary | ICD-10-CM | POA: Diagnosis not present

## 2023-04-09 DIAGNOSIS — N1832 Chronic kidney disease, stage 3b: Secondary | ICD-10-CM

## 2023-04-09 DIAGNOSIS — I350 Nonrheumatic aortic (valve) stenosis: Secondary | ICD-10-CM | POA: Diagnosis not present

## 2023-04-09 DIAGNOSIS — K5909 Other constipation: Secondary | ICD-10-CM

## 2023-04-09 DIAGNOSIS — I4811 Longstanding persistent atrial fibrillation: Secondary | ICD-10-CM

## 2023-04-09 DIAGNOSIS — H9113 Presbycusis, bilateral: Secondary | ICD-10-CM

## 2023-04-09 NOTE — Progress Notes (Addendum)
 Location:  Other [facility].  Nursing Home Room Number: [id number] Place of Service:  ALF [phone]) Provider:  Earnestine Mealing, MD  Patient Care Team: Earnestine Mealing, MD as PCP - General (Family Medicine) Sharon Seller, NP as Nurse Practitioner (Geriatric Medicine)  Extended Emergency Contact Information Primary Emergency Contact: Gwynneth Aliment Address: [place] [address] [place] Home Phone: [phone] Mobile Phone: [phone] Relation: Daughter Secondary Emergency Contact: Theodosia Blender [place] Home Phone: [phone] Mobile Phone: [phone] Relation: Daughter  Code Status:  DNR Goals of care: Advanced Directive information    04/07/2023    1:22 PM  Advanced Directives  Does Patient Have a Medical Advance Directive? Yes  Type of Advance Directive Out of facility DNR (pink MOST or yellow form)  Does patient want to make changes to medical advance directive? No - Patient declined     Chief Complaint  Patient presents with   New Admit To SNF    Admission to [facility].     HPI:  Pt is a 89 y.o. female seen today for Admission to [facility].   Patient has transitioned through all levels of care. Iniitally in indpendent living. 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 She has been in  AL for ~5 years.   Over the last few days she has had increased shortness of breath and unable to lay flat on her back.   She is happy to see familiar faces at skilled nursing. She is seeing friends she knew in the community before moving to [facility].   20 minute conference with daughters and grandson regarding current status. They agree with making sure symptoms are well-controlled and agreeable to higher level of care understanding she is requiring more support at this time. Discussed weight gain, swelling, and elevated BNP consistent with CHF exacerbation. Urinary incontinence not  consistent with AL and recommend healthcare for LTC.    Past Medical History:  Diagnosis Date   Cancer (HCC) skin   Cataract    Hearing loss    Osteoporosis    Vascular dementia [facility])    Past Surgical History:  Procedure Laterality Date   BREAST BIOPSY Left neg   BREAST CYST ASPIRATION Bilateral    EYE SURGERY Bilateral    HERNIA REPAIR     INTRAMEDULLARY (IM) NAIL INTERTROCHANTERIC Left 02/02/2015   Procedure: INTRAMEDULLARY (IM) NAIL INTERTROCHANTRIC;  Surgeon: Danelle Earthly, MD;  Location: [facility] ORS;  Service: Orthopedics;  Laterality: Left;    Allergies  Allergen Reactions   Simvastatin     Other reaction(s): Muscle Pain   Levofloxacin Rash   Penicillins Rash    Has patient had a PCN reaction causing immediate rash, facial/tongue/throat swelling, SOB or lightheadedness with hypotension: Yes Has patient had a PCN reaction causing severe rash involving mucus membranes or skin necrosis: No Has patient had a PCN reaction that required hospitalization Yes Has patient had a PCN reaction occurring within the last 10 years: No If all of the above answers are "NO", then may proceed with Cephalosporin use.    Outpatient Encounter Medications as of 04/09/2023  Medication Sig   acetaminophen (TYLENOL) 325 MG tablet Take 650 mg by mouth every 4 (four) hours as needed.   acetaminophen (TYLENOL) 650 MG CR tablet Take 650 mg by mouth in the morning and at bedtime.   alum & mag hydroxide-simeth (MAALOX/MYLANTA) 200-200-20 MG/5ML suspension Give 2 Tbsp  by mouth every 4 hours as needed for gas, indigestion, or upset stomach supervised self-administration Notify MD if no relief.   aspirin EC 81 MG tablet Take 81 mg by mouth daily. Swallow whole.   BISMUTH SUBSALICYLATE PO Take 10 mLs by mouth as needed.   Carbamide Peroxide (DEBROX OT) Place 5 drops in ear(s) as needed.   cetirizine (ZYRTEC) 10 MG tablet Take 10 mg by mouth as needed for allergies.   cholecalciferol (VITAMIN D3) 25 MCG  (1000 UNIT) tablet Take 1,000 Units by mouth daily.   cyanocobalamin (VITAMIN B12) 500 MCG tablet Take 500 mcg by mouth daily.   dextromethorphan-guaiFENesin (ROBITUSSIN-DM) 10-100 MG/5ML liquid Give 2 tsp by mouth every 4 hours as needed for coughing. supervised self-administration Notify MD if no relief in 3 days or continued use   furosemide (LASIX) 40 MG tablet Take 40 mg by mouth daily.   Glucose 15 GM/32ML GEL Take 1 packet by mouth as needed.   ipratropium-albuterol (DUONEB) 0.5-2.5 (3) MG/3ML SOLN Take 3 mLs by nebulization every 6 (six) hours as needed.   lisinopril (ZESTRIL) 5 MG tablet Take 5 mg by mouth daily.   magnesium hydroxide (MILK OF MAGNESIA) 400 MG/5ML suspension Give 2 Tbsp by mouth as needed for constipation. supervised self-administration daily  Call MD if no relief in 3 days of continued use.   metoprolol succinate (TOPROL-XL) 25 MG 24 hr tablet Take 25 mg by mouth daily.   nystatin powder Apply 1 Application topically 2 (two) times daily.   ondansetron (ZOFRAN) 4 MG tablet Take 4 mg by mouth as needed for nausea or vomiting. Up to 3 times a day   OXYGEN 2lpm via n/c for dyspnea or SOB.   polyethylene glycol (MIRALAX / GLYCOLAX) 17 g packet Take 17 g by mouth daily.   senna (SENOKOT) 8.6 MG tablet Take 1 tablet by mouth daily.   No facility-administered encounter medications on file as of 04/09/2023.    Review of Systems  Immunization History  Administered Date(s) Administered   Influenza-Unspecified 11/21/2019, 11/19/2020, 11/19/2021, 11/20/2022   Moderna Covid-19 Fall Seasonal Vaccine 23yrs & older 05/12/2022   Moderna Sars-Covid-2 Vaccination 02/16/2019, 03/16/2019, 12/15/2019, 12/12/2021, 05/12/2022   Pneumococcal-Unspecified 09/04/1991   Unspecified SARS-COV-2 Vaccination 12/15/2019, 06/21/2020, 10/25/2020, 07/01/2021, 12/17/2022   Pertinent  Health Maintenance Due  Topic Date Due   DEXA SCAN  Never done   INFLUENZA VACCINE  Completed      05/11/2019     3:49 PM 05/18/2019   10:00 AM 08/07/2020    3:35 PM 10/18/2020    3:41 PM 10/19/2020   12:49 PM  Fall Risk  Falls in the past year? 1 0     Was there an injury with Fall? 1 0     Fall Risk Category Calculator 2 0     Fall Risk Category (Retired) Moderate Low     (RETIRED) Patient Fall Risk Level Moderate fall risk Low fall risk High fall risk Moderate fall risk Moderate fall risk   Functional Status Survey:    Vitals:   04/09/23 1006  BP: 115/61  Pulse: 93  Resp: 19  Temp: (!) 97.5 F (36.4 C)  SpO2: (!) 86%  Weight: 149 lb 8 oz (67.8 kg)  Height: 5\' 3"  (1.6 m)   Body mass index is 26.48 kg/m. Physical Exam Constitutional:      Comments: Thin, kyphosis  Cardiovascular:     Rate and Rhythm: Normal rate.  Pulmonary:     Effort: Pulmonary effort is  normal.     Breath sounds: No wheezing.  Musculoskeletal:        General: No swelling.  Neurological:     General: No focal deficit present.     Mental Status: She is alert. Mental status is at baseline.     Comments: Pleasant.     Labs reviewed: Recent Labs    11/19/22 0000 03/08/23 0000 04/08/23 0000  NA 139 136* 139  K 5.1 4.9 4.5  CL 106 103 103  CO2 25* 29* 27*  BUN 37* 32* 29*  CREATININE 1.4* 1.4* 1.3*  CALCIUM 9.3 9.7 9.3   Recent Labs    05/21/22 0000 11/19/22 0000  AST 15 12*  ALT 10 9  ALKPHOS 80 90  ALBUMIN 3.9 3.7   Recent Labs    05/21/22 0000 11/19/22 0000 03/08/23 0000 04/08/23 0000  WBC 4.7 5.6 5.3 8.5  NEUTROABS 2,900.00 3,315.00 3,694.00  --   HGB 13.2 13.2 14.5 13.3  HCT 40 40 45 40  PLT 199 197 215 210   Lab Results  Component Value Date   TSH 2.07 03/08/2023   Lab Results  Component Value Date   HGBA1C 5.5 02/03/2015   No results found for: "CHOL", "HDL", "LDLCALC", "LDLDIRECT", "TRIG", "CHOLHDL"  Significant Diagnostic Results in last 30 days:  No results found.  Assessment/Plan Severe aortic stenosis [I35.0]  Acute systolic congestive heart failure, NYHA class 3  (HCC)  Primary hypertension  Vascular dementia without behavioral disturbance, psychotic disturbance, mood disturbance, or anxiety, unspecified dementia severity (HCC)  Other constipation  Longstanding persistent atrial fibrillation (HCC)  Stage 3b chronic kidney disease (HCC)  Presbycusis of both ears Patient with history of dementia who is admitting to [facility] for long term care. She has had continued decline over the last few months with more confusion. This week has had episodes of incontinence in both urine and stool per nursing report. Conference with family members who are in agreement with this transfer. BNP elevated to >270 consistent with atrial stretch. CXR with cardiomegaly. Bilateral lower extremity edema, orthopnea, and DOE consistent with CHF exacerbation. Will plan for repeat echocardiogram given no recent imaging. Lasix 40 mg BID until wieght at baseline or change in kidney function. Continue metoprolol tartrate 25 mg QID given tachycardia, will consolidate to succinate once new dosing at that time. BP well-controlled with zestril. Hold stool regimen for diarrhea. No anticoagulation at this time for atrial fibrillation. FAST stage 6c at this time requiring support for showering, dressing, and now urination/stooling. DNR and DNH. Continue supportive measures with physical therapy and occupational therapy as tolerated.    Family/ staff Communication: Nursing, Suzanna Obey, SW on all levels of care.   Labs/tests ordered:  CBC, BMP, CXR   I spent greater than 45  minutes for the care of this patient in face to face time, chart review, clinical documentation, patient education. I spent an additional 33 minutes discussing goals of care and advanced care planning.

## 2023-04-10 ENCOUNTER — Encounter: Payer: Self-pay | Admitting: Student

## 2023-04-12 ENCOUNTER — Telehealth: Payer: Self-pay | Admitting: Orthopedic Surgery

## 2023-04-12 DIAGNOSIS — J189 Pneumonia, unspecified organism: Secondary | ICD-10-CM

## 2023-04-12 MED ORDER — DOXYCYCLINE HYCLATE 100 MG PO TABS: 100.0000 mg | ORAL_TABLET | Freq: Two times a day (BID) | ORAL | Status: AC

## 2023-04-12 NOTE — Telephone Encounter (Signed)
 CXR positive for left infiltrate. Patient with allergy to PCN and levofloxacin. Will start doxycycline for pneumonia.

## 2023-05-04 ENCOUNTER — Encounter: Payer: Self-pay | Admitting: Nurse Practitioner

## 2023-05-04 ENCOUNTER — Non-Acute Institutional Stay (SKILLED_NURSING_FACILITY): Payer: Self-pay | Admitting: Nurse Practitioner

## 2023-05-04 DIAGNOSIS — I5021 Acute systolic (congestive) heart failure: Secondary | ICD-10-CM

## 2023-05-04 NOTE — Progress Notes (Signed)
 Location:  Other [facility]) Nursing Home Room Number: [id number] Place of Service:  ALF [phone])  Earnestine Mealing, MD  Patient Care Team: Earnestine Mealing, MD as PCP - General (Family Medicine) Sharon Seller, NP as Nurse Practitioner (Geriatric Medicine)  Extended Emergency Contact Information Primary Emergency Contact: Gwynneth Aliment Address: [place] [address] [place] Home Phone: [phone] Mobile Phone: [phone] Relation: Daughter Secondary Emergency Contact: Theodosia Blender [place] Home Phone: [phone] Mobile Phone: [phone] Relation: Daughter  Goals of care: Advanced Directive information    04/07/2023    1:22 PM  Advanced Directives  Does Patient Have a Medical Advance Directive? Yes  Type of Advance Directive Out of facility DNR (pink MOST or yellow form)  Does patient want to make changes to medical advance directive? No - Patient declined     Chief Complaint  Patient presents with   Breathing Problem    HPI:  Pt is a 89 y.o. female seen today for shortness of breath, cough and congestion She has had scheduled nebs which helps for a short amount of time.  She has hx of CHF, dementia and recent pneumonia No fevers or chills Has has positive weight gain and was given lasix 40 mg x1 yesterday due to this.  She is not on scheduled lasix at this time.  Does have some worsening LE edema as well.  O2 ranging from 85-90%  Nursing encouraging deep breathing.    Past Medical History:  Diagnosis Date   Cancer (HCC) skin   Cataract    Hearing loss    Osteoporosis    Vascular dementia [facility])    Past Surgical History:  Procedure Laterality Date   BREAST BIOPSY Left neg   BREAST CYST ASPIRATION Bilateral    EYE SURGERY Bilateral    HERNIA REPAIR     INTRAMEDULLARY (IM) NAIL INTERTROCHANTERIC Left 02/02/2015   Procedure: INTRAMEDULLARY (IM) NAIL INTERTROCHANTRIC;  Surgeon: Danelle Earthly, MD;   Location: [facility] ORS;  Service: Orthopedics;  Laterality: Left;    Allergies  Allergen Reactions   Simvastatin     Other reaction(s): Muscle Pain   Levofloxacin Rash   Penicillins Rash    Has patient had a PCN reaction causing immediate rash, facial/tongue/throat swelling, SOB or lightheadedness with hypotension: Yes Has patient had a PCN reaction causing severe rash involving mucus membranes or skin necrosis: No Has patient had a PCN reaction that required hospitalization Yes Has patient had a PCN reaction occurring within the last 10 years: No If all of the above answers are "NO", then may proceed with Cephalosporin use.    Outpatient Encounter Medications as of 05/04/2023  Medication Sig   acetaminophen (TYLENOL) 325 MG tablet Take 650 mg by mouth every 4 (four) hours as needed.   acetaminophen (TYLENOL) 650 MG CR tablet Take 650 mg by mouth in the morning and at bedtime.   alum & mag hydroxide-simeth (MAALOX/MYLANTA) 200-200-20 MG/5ML suspension Give 2 Tbsp by mouth every 4 hours as needed for gas, indigestion, or upset stomach supervised self-administration Notify MD if no relief.   aspirin EC 81 MG tablet Take 81 mg by mouth daily. Swallow whole.   BISMUTH SUBSALICYLATE PO Take 10 mLs by mouth as needed.   Carbamide Peroxide (DEBROX OT) Place 5 drops in ear(s) as needed.   cetirizine (ZYRTEC) 10 MG tablet Take 10 mg by mouth as needed for allergies.   cholecalciferol (VITAMIN D3) 25  MCG (1000 UNIT) tablet Take 1,000 Units by mouth daily.   cyanocobalamin (VITAMIN B12) 500 MCG tablet Take 500 mcg by mouth daily.   dextromethorphan-guaiFENesin (ROBITUSSIN-DM) 10-100 MG/5ML liquid Give 2 tsp by mouth every 4 hours as needed for coughing. supervised self-administration Notify MD if no relief in 3 days or continued use   furosemide (LASIX) 40 MG tablet Take 40 mg by mouth daily.   Glucose 15 GM/32ML GEL Take 1 packet by mouth as needed.   ipratropium-albuterol (DUONEB) 0.5-2.5 (3)  MG/3ML SOLN Take 3 mLs by nebulization every 6 (six) hours as needed.   lisinopril (ZESTRIL) 5 MG tablet Take 5 mg by mouth daily.   magnesium hydroxide (MILK OF MAGNESIA) 400 MG/5ML suspension Give 2 Tbsp by mouth as needed for constipation. supervised self-administration daily  Call MD if no relief in 3 days of continued use.   metoprolol succinate (TOPROL-XL) 25 MG 24 hr tablet Take 25 mg by mouth daily.   nystatin powder Apply 1 Application topically 2 (two) times daily.   ondansetron (ZOFRAN) 4 MG tablet Take 4 mg by mouth as needed for nausea or vomiting. Up to 3 times a day   OXYGEN 2lpm via n/c for dyspnea or SOB.   polyethylene glycol (MIRALAX / GLYCOLAX) 17 g packet Take 17 g by mouth daily.   senna (SENOKOT) 8.6 MG tablet Take 1 tablet by mouth daily.   No facility-administered encounter medications on file as of 05/04/2023.    Review of Systems  Constitutional:  Negative for chills and fever.  Respiratory:  Positive for cough, shortness of breath and wheezing.   Cardiovascular:  Positive for leg swelling. Negative for chest pain and palpitations.  Gastrointestinal:  Negative for abdominal pain, constipation and diarrhea.  Skin: Negative.   Neurological:  Negative for dizziness and headaches.  Psychiatric/Behavioral:  Positive for confusion.      Immunization History  Administered Date(s) Administered   Influenza-Unspecified 11/21/2019, 11/19/2020, 11/19/2021, 11/20/2022   Moderna Covid-19 Fall Seasonal Vaccine 38yrs & older 05/12/2022   Moderna Sars-Covid-2 Vaccination 02/16/2019, 03/16/2019, 12/15/2019, 12/12/2021, 05/12/2022   Pneumococcal-Unspecified 09/04/1991   Unspecified SARS-COV-2 Vaccination 12/15/2019, 06/21/2020, 10/25/2020, 07/01/2021, 12/17/2022   Pertinent  Health Maintenance Due  Topic Date Due   DEXA SCAN  Never done   INFLUENZA VACCINE  09/03/2023      05/11/2019    3:49 PM 05/18/2019   10:00 AM 08/07/2020    3:35 PM 10/18/2020    3:41 PM 10/19/2020    12:49 PM  Fall Risk  Falls in the past year? 1 0     Was there an injury with Fall? 1 0     Fall Risk Category Calculator 2 0     Fall Risk Category (Retired) Moderate Low     (RETIRED) Patient Fall Risk Level Moderate fall risk Low fall risk High fall risk Moderate fall risk Moderate fall risk   Functional Status Survey:    Vitals:   05/04/23 1444  BP: 138/78  Pulse: 89  Resp: (!) 22  Temp: 98.4 F (36.9 C)  SpO2: 90%  Weight: 147 lb 3.2 oz (66.8 kg)  Height: 5\' 3"  (1.6 m)   Body mass index is 26.08 kg/m. Physical Exam Constitutional:      General: She is not in acute distress.    Appearance: She is well-developed. She is not diaphoretic.  HENT:     Head: Normocephalic and atraumatic.     Mouth/Throat:     Pharynx: No oropharyngeal exudate.  Eyes:     Conjunctiva/sclera: Conjunctivae normal.     Pupils: Pupils are equal, round, and reactive to light.  Cardiovascular:     Rate and Rhythm: Normal rate and regular rhythm.     Heart sounds: Normal heart sounds.  Pulmonary:     Effort: Pulmonary effort is normal.     Breath sounds: Examination of the right-lower field reveals rales. Examination of the left-lower field reveals rales. Rales present.  Abdominal:     General: Bowel sounds are normal.     Palpations: Abdomen is soft.  Musculoskeletal:     Cervical back: Normal range of motion and neck supple.     Right lower leg: No edema.     Left lower leg: No edema.  Skin:    General: Skin is warm and dry.  Neurological:     Mental Status: She is alert.  Psychiatric:        Mood and Affect: Mood normal.     Labs reviewed: Recent Labs    11/19/22 0000 03/08/23 0000 04/08/23 0000  NA 139 136* 139  K 5.1 4.9 4.5  CL 106 103 103  CO2 25* 29* 27*  BUN 37* 32* 29*  CREATININE 1.4* 1.4* 1.3*  CALCIUM 9.3 9.7 9.3   Recent Labs    05/21/22 0000 11/19/22 0000  AST 15 12*  ALT 10 9  ALKPHOS 80 90  ALBUMIN 3.9 3.7   Recent Labs    05/21/22 0000  11/19/22 0000 03/08/23 0000 04/08/23 0000  WBC 4.7 5.6 5.3 8.5  NEUTROABS 2,900.00 3,315.00 3,694.00  --   HGB 13.2 13.2 14.5 13.3  HCT 40 40 45 40  PLT 199 197 215 210   Lab Results  Component Value Date   TSH 2.07 03/08/2023   Lab Results  Component Value Date   HGBA1C 5.5 02/03/2015   No results found for: "CHOL", "HDL", "LDLCALC", "LDLDIRECT", "TRIG", "CHOLHDL"  Significant Diagnostic Results in last 30 days:  No results found.  Assessment/Plan 1. Acute systolic congestive heart failure, NYHA class 3 (HCC) (Primary) -not currently on lasix which she was previously taking -with weight gain, LE edema and shortness of breath will start back lasix 40 mg daily -cbc, bmp, bnp ordered Chest xray O2 to keep sats over 90% PRN Notify for worsening of symptoms.    Janene Harvey. [facility] [phone]

## 2023-05-06 LAB — CBC AND DIFFERENTIAL
HCT: 40 (ref 36–46)
Hemoglobin: 12.8 (ref 12.0–16.0)
Platelets: 198 10*3/uL (ref 150–400)
WBC: 6

## 2023-05-06 LAB — BASIC METABOLIC PANEL WITH GFR
BUN: 25 — AB (ref 4–21)
CO2: 23 — AB (ref 13–22)
Chloride: 106 (ref 99–108)
Creatinine: 1.3 — AB (ref 0.5–1.1)
Glucose: 95
Potassium: 5 meq/L (ref 3.5–5.1)
Sodium: 139 (ref 137–147)

## 2023-05-06 LAB — COMPREHENSIVE METABOLIC PANEL WITH GFR
Calcium: 9.2 (ref 8.7–10.7)
eGFR: 38

## 2023-05-06 LAB — CBC: RBC: 3.88 (ref 3.87–5.11)

## 2023-05-11 IMAGING — CT CT L SPINE W/O CM
3 of 4 series · 9 of 35 positions shown, 11 images · IV contrast (APPLIED)
Comparison: CT abdomen 10/19/2020 as well as lumbar spine
radiographs from 08/07/2020

CLINICAL DATA: Compression deformities at L4 and L2

EXAM:
CT LUMBAR SPINE WITHOUT CONTRAST
TECHNIQUE: Multidetector CT imaging of the lumbar spine was performed without
intravenous contrast administration. Multiplanar CT image
reconstructions were also generated.

[Series 4: multi spine · axial · 0.26mm/px · z∈[-323,-323]mm · 1 of 147 slices shown, 2 images]
[im 74/147  soft-tissue]
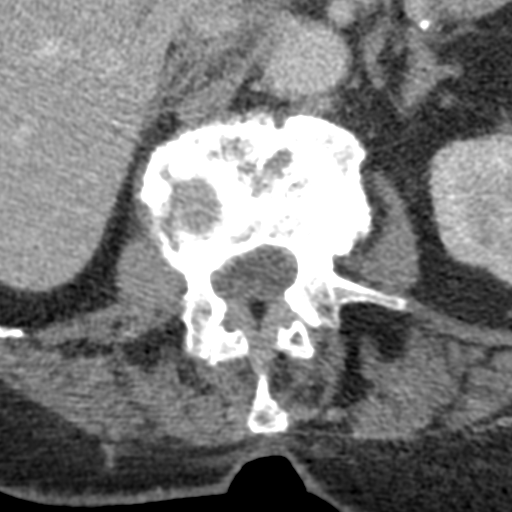
[im 74/147  bone]
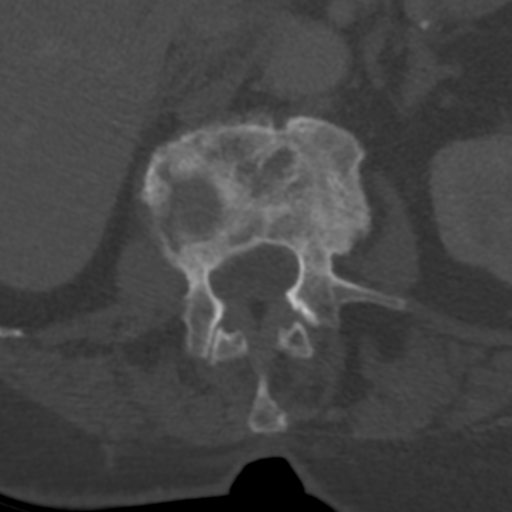

[Series 6: ax cor x cor bone l-spine cor · coronal · 0.48mm/px · 3 of 133 slices shown]
[im 27/133  bone]
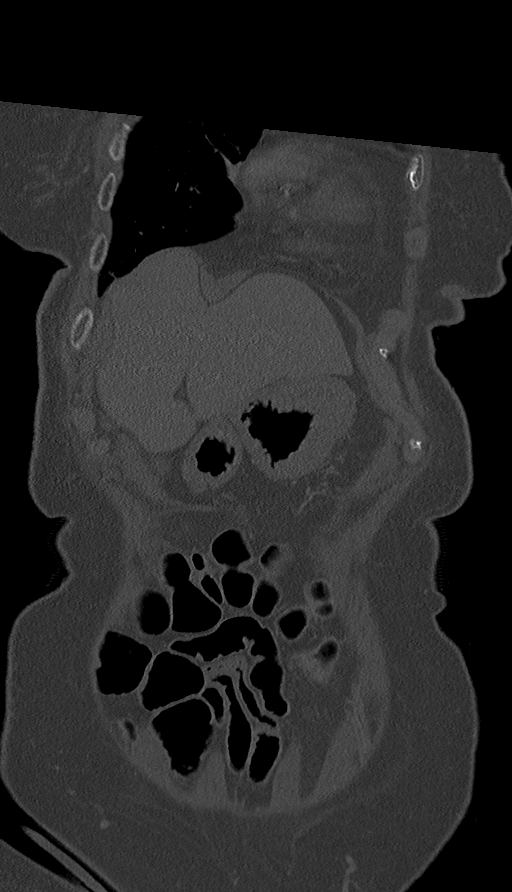
[im 53/133  bone]
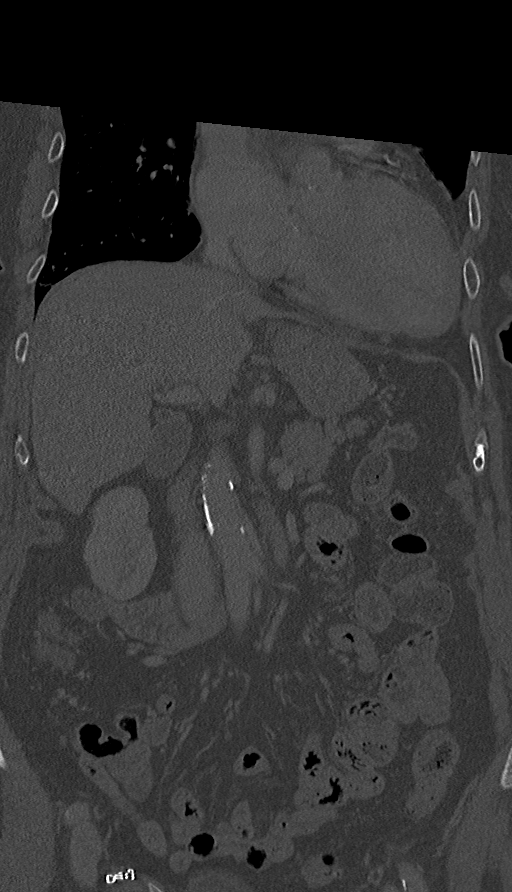
[im 80/133  bone]
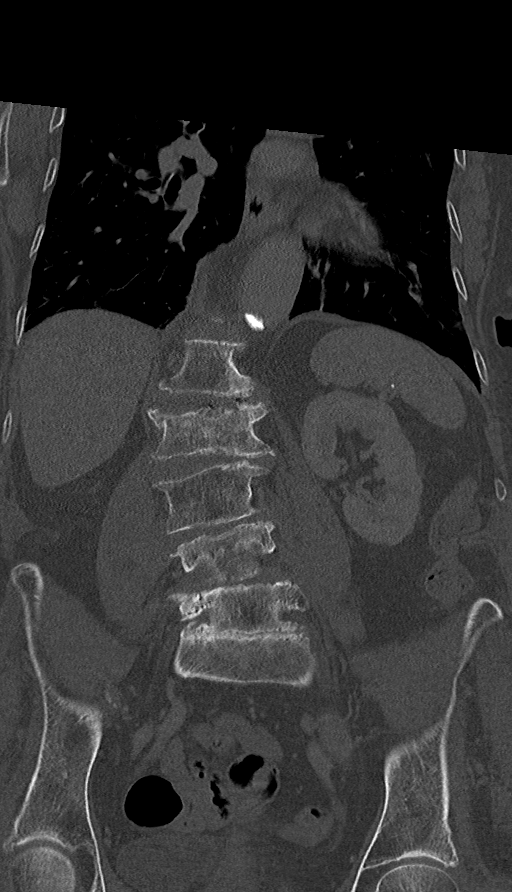

[Series 7: ax sag x sag bone l-spine sag · sagittal · 0.39mm/px · 5 of 211 slices shown, 6 images]
[im 71/211  bone]
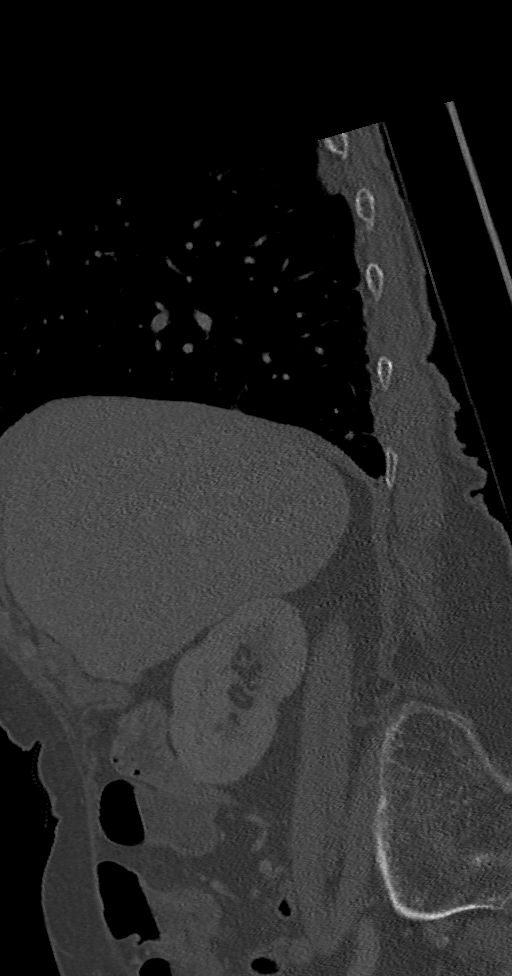
[im 88/211  bone]
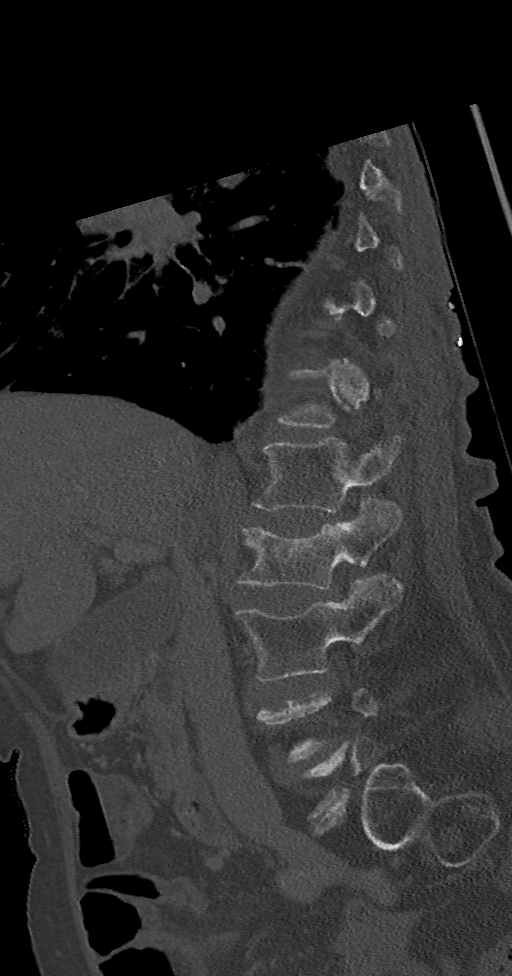
[im 106/211  soft-tissue]
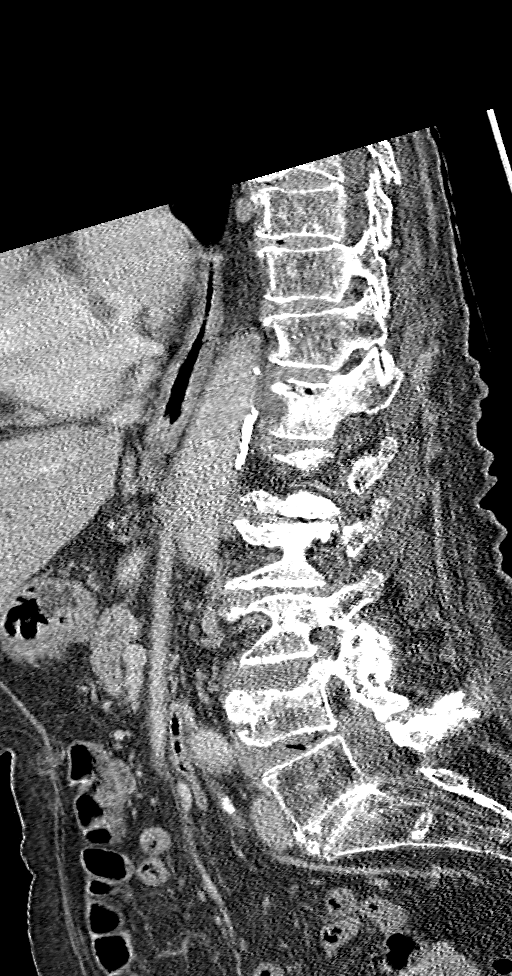
[im 106/211  bone]
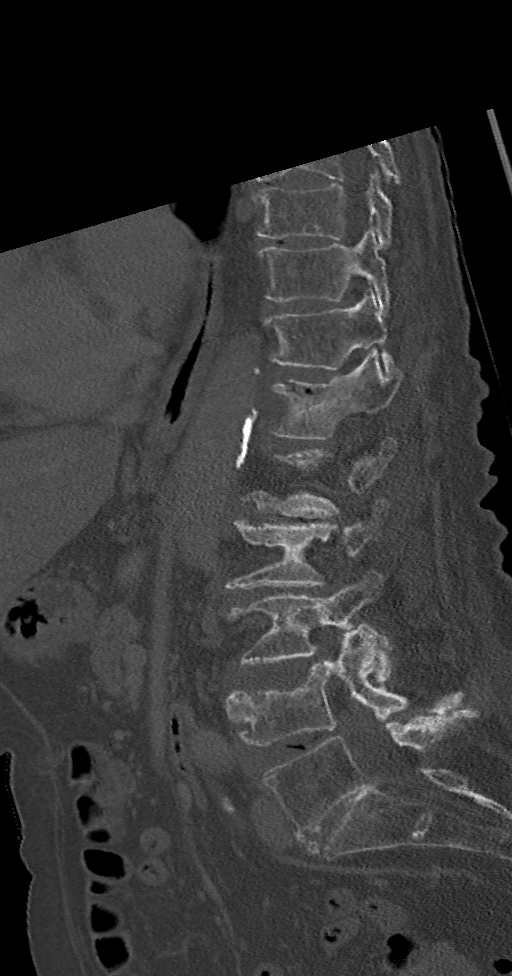
[im 123/211  bone]
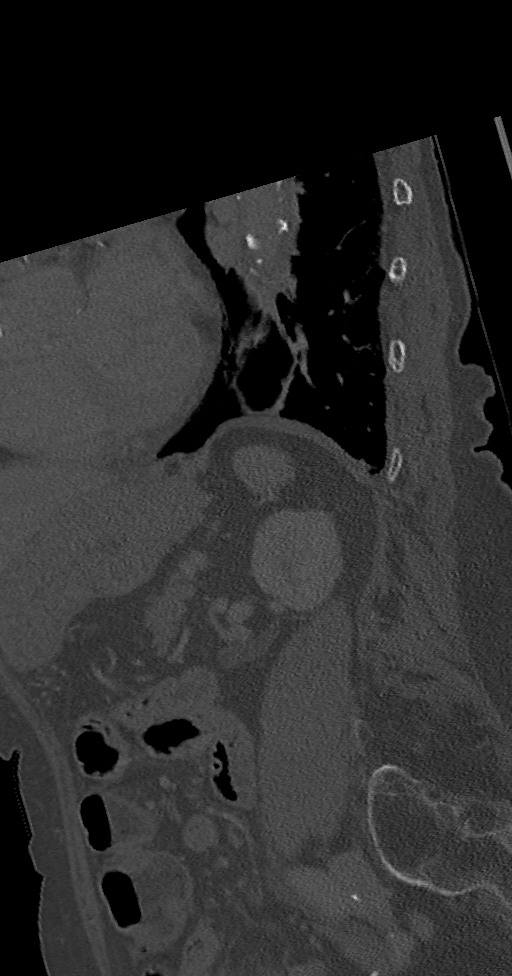
[im 141/211  bone]
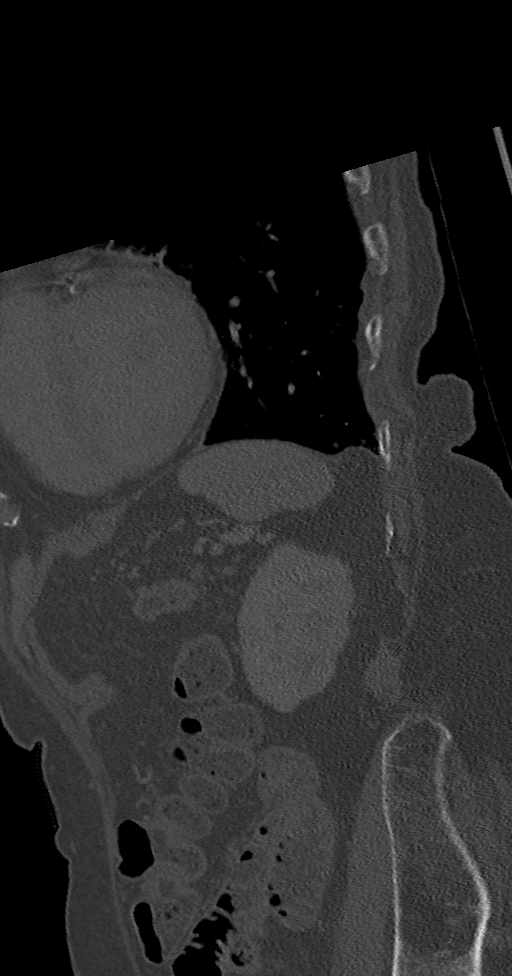

[9 of 35 positions shown; findings below may reference images not displayed]

FINDINGS: Segmentation: The lowest lumbar type non-rib-bearing vertebra is
labeled as L5.

Alignment: 2 mm degenerative retrolisthesis at L2-3.

Vertebrae: At T11 there is a chronic proximally 10% superior
endplate compression fracture.

At T12, there is a is a acute/early subacute 10% superior endplate
compression fracture with visible subcortical fracture plane along
the superior endplate containing a small amount of nitrogen gas
phenomenon. 3 mm of associated posterior retropulsion. Mild
paraspinal edema.

At L2 there is a 15-20% superior endplate compression fracture
eccentric to the right, with sclerosis in the vertebral body and
along the superior endplate suggesting subacute chronicity. There
2-3 mm of posterior bony retropulsion along the right
posterosuperior endplate.

L4, there is a 40% superior endplate compression fracture with 3 mm
of chronic posterior bony retropulsion, age indeterminate but likely
chronic or late subacute.

I do not see substantial posterior column involvement in any of
these fractures.

There is considerable dextroconvex lumbar scoliosis with rotary
component.

Paraspinal and other soft tissues: Please refer to the dedicated CT
abdomen report by Dr. Musat.

Disc levels: L1-2: Mild left foraminal stenosis due to facet
arthropathy and intervertebral spurring.

L2-3: Mild left foraminal stenosis due to facet and intervertebral
spurring.

L3-4: Mild left foraminal stenosis and moderate central narrowing of
the thecal sac due to congenitally short pedicles, chronic posterior
bony retropulsion, and degenerative facet arthropathy.

L4-5: Moderate right and mild left foraminal stenosis and potential
central narrowing of the thecal sac due to disc bulge, facet
arthropathy, and intervertebral spurring.

L5-S1: Moderate right foraminal stenosis due to intervertebral
spurring.
IMPRESSION: 1. Acute or early subacute 10% superior endplate compression
fracture at T12, with paraspinal edema.
2. Likely late subacute 15-20% superior endplate compression
fracture at L2.
3. Likely late subacute or chronic 40% superior endplate compression
fracture at L4.
4. Lumbar spondylosis, scoliosis, congenitally short pedicles, and
degenerative disc disease contribute to at least moderate
impingement at L3-4, L4-5, and L5-S1; and mild impingement at L1-2
and L2-3 as detailed above.

## 2023-05-13 ENCOUNTER — Non-Acute Institutional Stay (SKILLED_NURSING_FACILITY): Payer: Self-pay | Admitting: Nurse Practitioner

## 2023-05-13 DIAGNOSIS — F0150 Vascular dementia without behavioral disturbance: Secondary | ICD-10-CM

## 2023-05-13 DIAGNOSIS — H9113 Presbycusis, bilateral: Secondary | ICD-10-CM

## 2023-05-13 DIAGNOSIS — N1832 Chronic kidney disease, stage 3b: Secondary | ICD-10-CM

## 2023-05-13 DIAGNOSIS — I10 Essential (primary) hypertension: Secondary | ICD-10-CM | POA: Diagnosis not present

## 2023-05-13 DIAGNOSIS — I5021 Acute systolic (congestive) heart failure: Secondary | ICD-10-CM

## 2023-05-13 DIAGNOSIS — I4811 Longstanding persistent atrial fibrillation: Secondary | ICD-10-CM

## 2023-05-13 NOTE — Progress Notes (Signed)
 Location:  Other ([facility])   Place of Service:  ALF (13)  Valrie Gehrig, MD  Patient Care Team: Valrie Gehrig, MD as PCP - General (Family Medicine) Verma Gobble, NP as Nurse Practitioner (Geriatric Medicine)  Extended Emergency Contact Information Primary Emergency Contact: Baby Bolt Address: [place] [address] [place] Home Phone: [phone] Mobile Phone: [phone] Relation: Daughter Secondary Emergency Contact: eisenhauer,ruth  [place] Home Phone: [phone] Mobile Phone: [phone] Relation: Daughter  Goals of care: Advanced Directive information    04/07/2023    1:22 PM  Advanced Directives  Does Patient Have a Medical Advance Directive? Yes  Type of Advance Directive Out of facility DNR (pink MOST or yellow form)  Does patient want to make changes to medical advance directive? No - Patient declined     Chief Complaint  Patient presents with   Medical Management of Chronic Issues    HPI:  Pt is a 89 y.o. female seen today for medical management of chronic disease.  She was seen last week due to worsening shortness of breath. Lasix 40 mg by mouth daily was restarted. Staff reports she continues to have worsening shortness of breath with activity and decrease in O2.  Staff has her on O2 continuously.    Past Medical History:  Diagnosis Date   Cancer (HCC) skin   Cataract    Hearing loss    Osteoporosis    Vascular dementia [facility])    Past Surgical History:  Procedure Laterality Date   BREAST BIOPSY Left neg   BREAST CYST ASPIRATION Bilateral    EYE SURGERY Bilateral    HERNIA REPAIR     INTRAMEDULLARY (IM) NAIL INTERTROCHANTERIC Left 02/02/2015   Procedure: INTRAMEDULLARY (IM) NAIL INTERTROCHANTRIC;  Surgeon: Cesario Collum, MD;  Location: [facility] ORS;  Service: Orthopedics;  Laterality: Left;    Allergies  Allergen Reactions   Simvastatin     Other reaction(s): Muscle Pain    Levofloxacin Rash   Penicillins Rash    Has patient had a PCN reaction causing immediate rash, facial/tongue/throat swelling, SOB or lightheadedness with hypotension: Yes Has patient had a PCN reaction causing severe rash involving mucus membranes or skin necrosis: No Has patient had a PCN reaction that required hospitalization Yes Has patient had a PCN reaction occurring within the last 10 years: No If all of the above answers are "NO", then may proceed with Cephalosporin use.    Outpatient Encounter Medications as of 05/13/2023  Medication Sig   acetaminophen (TYLENOL) 650 MG CR tablet Take 650 mg by mouth in the morning and at bedtime.   cyanocobalamin (VITAMIN B12) 500 MCG tablet Take 500 mcg by mouth daily.   furosemide (LASIX) 40 MG tablet Take 40 mg by mouth daily.   ipratropium-albuterol (DUONEB) 0.5-2.5 (3) MG/3ML SOLN Take 3 mLs by nebulization every 6 (six) hours as needed.   lisinopril (ZESTRIL) 5 MG tablet Take 5 mg by mouth daily.   metoprolol succinate (TOPROL-XL) 25 MG 24 hr tablet Take 25 mg by mouth daily.   polyethylene glycol (MIRALAX / GLYCOLAX) 17 g packet Take 17 g by mouth daily.   senna (SENOKOT) 8.6 MG tablet Take 1 tablet by mouth daily.   No facility-administered encounter medications on file as of 05/13/2023.    Review of Systems  Constitutional:  Negative for activity change, appetite change, fatigue and unexpected weight change.  HENT:  Positive for hearing loss. Negative for  congestion.   Eyes: Negative.   Respiratory:  Positive for shortness of breath. Negative for cough.   Cardiovascular:  Positive for leg swelling. Negative for chest pain and palpitations.  Gastrointestinal:  Negative for abdominal pain, constipation and diarrhea.  Genitourinary:  Negative for difficulty urinating and dysuria.  Musculoskeletal:  Negative for arthralgias and myalgias.  Skin:  Negative for color change and wound.  Neurological:  Negative for dizziness and weakness.   Psychiatric/Behavioral:  Positive for confusion. Negative for agitation and behavioral problems.      Immunization History  Administered Date(s) Administered   Influenza-Unspecified 11/21/2019, 11/19/2020, 11/19/2021, 11/20/2022   Moderna Covid-19 Fall Seasonal Vaccine 97yrs & older 05/12/2022   Moderna Sars-Covid-2 Vaccination 02/16/2019, 03/16/2019, 12/15/2019, 12/12/2021, 05/12/2022   Pneumococcal-Unspecified 09/04/1991   Unspecified SARS-COV-2 Vaccination 12/15/2019, 06/21/2020, 10/25/2020, 07/01/2021, 12/17/2022   Pertinent  Health Maintenance Due  Topic Date Due   DEXA SCAN  Never done   INFLUENZA VACCINE  09/03/2023      05/11/2019    3:49 PM 05/18/2019   10:00 AM 08/07/2020    3:35 PM 10/18/2020    3:41 PM 10/19/2020   12:49 PM  Fall Risk  Falls in the past year? 1 0     Was there an injury with Fall? 1 0     Fall Risk Category Calculator 2 0     Fall Risk Category (Retired) Moderate Low     (RETIRED) Patient Fall Risk Level Moderate fall risk Low fall risk High fall risk Moderate fall risk Moderate fall risk   Functional Status Survey:    Vitals:   05/13/23 1150  BP: (!) 144/88  Pulse: 86  Resp: (!) 26  Temp: (!) 97.5 F (36.4 C)  SpO2: 92%   There is no height or weight on file to calculate BMI. Physical Exam Constitutional:      General: She is not in acute distress.    Appearance: She is well-developed. She is not diaphoretic.  HENT:     Head: Normocephalic and atraumatic.     Mouth/Throat:     Pharynx: No oropharyngeal exudate.  Eyes:     Conjunctiva/sclera: Conjunctivae normal.     Pupils: Pupils are equal, round, and reactive to light.  Cardiovascular:     Rate and Rhythm: Normal rate and regular rhythm.     Heart sounds: Normal heart sounds.  Pulmonary:     Effort: Pulmonary effort is normal.     Breath sounds: Normal breath sounds.     Comments: Increased RR with minimal activity Abdominal:     General: Bowel sounds are normal.      Palpations: Abdomen is soft.  Musculoskeletal:     Cervical back: Normal range of motion and neck supple.     Right lower leg: Edema present.     Left lower leg: Edema present.  Skin:    General: Skin is warm and dry.  Neurological:     Mental Status: She is alert.  Psychiatric:        Mood and Affect: Mood normal.     Labs reviewed: Recent Labs    11/19/22 0000 03/08/23 0000 04/08/23 0000  NA 139 136* 139  K 5.1 4.9 4.5  CL 106 103 103  CO2 25* 29* 27*  BUN 37* 32* 29*  CREATININE 1.4* 1.4* 1.3*  CALCIUM 9.3 9.7 9.3   Recent Labs    05/21/22 0000 11/19/22 0000  AST 15 12*  ALT 10 9  ALKPHOS 80 90  ALBUMIN 3.9 3.7   Recent Labs    05/21/22 0000 11/19/22 0000 03/08/23 0000 04/08/23 0000  WBC 4.7 5.6 5.3 8.5  NEUTROABS 2,900.00 3,315.00 3,694.00  --   HGB 13.2 13.2 14.5 13.3  HCT 40 40 45 40  PLT 199 197 215 210   Lab Results  Component Value Date   TSH 2.07 03/08/2023   Lab Results  Component Value Date   HGBA1C 5.5 02/03/2015   No results found for: "CHOL", "HDL", "LDLCALC", "LDLDIRECT", "TRIG", "CHOLHDL"  Significant Diagnostic Results in last 30 days:  No results found.  Assessment/Plan 1. Acute systolic congestive heart failure, NYHA class 3 (HCC) (Primary) Ongoing shortness of breath with minimal activity and decrease in O2 sats, will increase lasix to 40 mg BID for 3 days and then resume 40 mg daily BMP scheduled for follow up.   2. Primary hypertension -Blood pressure well controlled, goal bp <140/90 Continue current medications and dietary modifications follow metabolic panel  3. Vascular dementia without behavioral disturbance, psychotic disturbance, mood disturbance, or anxiety, unspecified dementia severity (HCC) -Stable, no acute changes in cognitive or functional status, continue supportive care.   4. Longstanding persistent atrial fibrillation (HCC) -rate controlled on metoprolol.   5. Stage 3b chronic kidney disease  (HCC) -Chronic and stable Encourage proper hydration Follow metabolic panel Avoid nephrotoxic meds (NSAIDS)  6. Presbycusis of both ears -continue supportive care.       Sya Nestler K. Denney Fisherman [facility] [phone]

## 2023-05-20 LAB — BASIC METABOLIC PANEL WITH GFR
BUN: 37 — AB (ref 4–21)
CO2: 29 — AB (ref 13–22)
Chloride: 102 (ref 99–108)
Creatinine: 1.5 — AB (ref 0.5–1.1)
Glucose: 80
Potassium: 4.5 meq/L (ref 3.5–5.1)
Sodium: 139 (ref 137–147)

## 2023-05-20 LAB — COMPREHENSIVE METABOLIC PANEL WITH GFR: Calcium: 8.9 (ref 8.7–10.7)

## 2023-06-04 ENCOUNTER — Encounter: Payer: Self-pay | Admitting: Student

## 2023-06-04 ENCOUNTER — Non-Acute Institutional Stay (SKILLED_NURSING_FACILITY): Payer: Self-pay | Admitting: Student

## 2023-06-04 DIAGNOSIS — F0150 Vascular dementia without behavioral disturbance: Secondary | ICD-10-CM | POA: Diagnosis not present

## 2023-06-04 DIAGNOSIS — I4811 Longstanding persistent atrial fibrillation: Secondary | ICD-10-CM

## 2023-06-04 DIAGNOSIS — I350 Nonrheumatic aortic (valve) stenosis: Secondary | ICD-10-CM

## 2023-06-04 DIAGNOSIS — I10 Essential (primary) hypertension: Secondary | ICD-10-CM | POA: Diagnosis not present

## 2023-06-04 NOTE — Progress Notes (Unsigned)
 Location:  Other [facility].  Nursing Home Room Number: [facility] [address] Place of Service:  SNF [phone]) Provider:  Valrie Gehrig, MD  Patient Care Team: Valrie Gehrig, MD as PCP - General (Family Medicine) Verma Gobble, NP as Nurse Practitioner (Geriatric Medicine)  Extended Emergency Contact Information Primary Emergency Contact: Baby Bolt Address: [place], [place] [address] [place] Home Phone: [phone] Mobile Phone: [phone] Relation: Daughter Secondary Emergency Contact: eisenhauer,ruth  [place] Home Phone: [phone] Mobile Phone: [phone] Relation: Daughter  Code Status:  DNR Goals of care: Advanced Directive information    04/07/2023    1:22 PM  Advanced Directives  Does Patient Have a Medical Advance Directive? Yes  Type of Advance Directive Out of facility DNR (pink MOST or yellow form)  Does patient want to make changes to medical advance directive? No - Patient declined     Chief Complaint  Patient presents with   Medical Management of Chronic Issues    Medical Management of Chronic Issues.     HPI:  Pt is a 89 y.o. female seen today for medical management of chronic diseases.   Patient states she had the most fun she has had in the last year. She went to a baseball game yesterday and to a farm today where she saw a cow. She states since her family lives so far away she doesn't get to leave and do too many activities. She states she has been fine. She says the only way she will wear compression stockings is if they are pretty and even then, she isn't sure she will wear them. Denies complaints at this time. Nursing state she has slept a bit more than when she first arrived, but is pleasant and without concerns otherwise.   Past Medical History:  Diagnosis Date   Cancer (HCC) skin   Cataract    Hearing loss    Osteoporosis    Vascular dementia [facility])    Past Surgical History:  Procedure  Laterality Date   BREAST BIOPSY Left neg   BREAST CYST ASPIRATION Bilateral    EYE SURGERY Bilateral    HERNIA REPAIR     INTRAMEDULLARY (IM) NAIL INTERTROCHANTERIC Left 02/02/2015   Procedure: INTRAMEDULLARY (IM) NAIL INTERTROCHANTRIC;  Surgeon: Cesario Collum, MD;  Location: [facility] ORS;  Service: Orthopedics;  Laterality: Left;    Allergies  Allergen Reactions   Simvastatin     Other reaction(s): Muscle Pain   Levofloxacin Rash   Penicillins Rash    Has patient had a PCN reaction causing immediate rash, facial/tongue/throat swelling, SOB or lightheadedness with hypotension: Yes Has patient had a PCN reaction causing severe rash involving mucus membranes or skin necrosis: No Has patient had a PCN reaction that required hospitalization Yes Has patient had a PCN reaction occurring within the last 10 years: No If all of the above answers are "NO", then may proceed with Cephalosporin use.    Outpatient Encounter Medications as of 06/04/2023  Medication Sig   acetaminophen  (TYLENOL ) 650 MG CR tablet Take 650 mg by mouth in the morning and at bedtime.   cyanocobalamin (VITAMIN B12) 500 MCG tablet Take 500 mcg by mouth daily.   furosemide (LASIX) 40 MG tablet Take 40 mg by mouth daily. As needed for weight gain 3lbs overnight or 5lbs in one week.   ipratropium-albuterol (DUONEB) 0.5-2.5 (3) MG/3ML SOLN Take 3 mLs by nebulization every 6 (six) hours as needed.  lisinopril (ZESTRIL) 5 MG tablet Take 5 mg by mouth daily.   metoprolol succinate (TOPROL-XL) 25 MG 24 hr tablet Take 25 mg by mouth daily.   OXYGEN Inhale into the lungs. 2lpm   polyethylene glycol (MIRALAX / GLYCOLAX) 17 g packet Take 17 g by mouth daily.   senna (SENOKOT) 8.6 MG tablet Take 1 tablet by mouth daily.   No facility-administered encounter medications on file as of 06/04/2023.    Review of Systems  Immunization History  Administered Date(s) Administered   Influenza-Unspecified 11/21/2019, 11/19/2020, 11/19/2021,  11/20/2022   Moderna Covid-19 Fall Seasonal Vaccine 18yrs & older 05/12/2022, 05/14/2023   Moderna Sars-Covid-2 Vaccination 02/16/2019, 03/16/2019, 12/15/2019, 12/12/2021, 05/12/2022   Pneumococcal-Unspecified 09/04/1991   Unspecified SARS-COV-2 Vaccination 12/15/2019, 06/21/2020, 10/25/2020, 07/01/2021, 12/17/2022   Pertinent  Health Maintenance Due  Topic Date Due   DEXA SCAN  Never done   INFLUENZA VACCINE  09/03/2023      05/11/2019    3:49 PM 05/18/2019   10:00 AM 08/07/2020    3:35 PM 10/18/2020    3:41 PM 10/19/2020   12:49 PM  Fall Risk  Falls in the past year? 1 0     Was there an injury with Fall? 1 0     Fall Risk Category Calculator 2 0     Fall Risk Category (Retired) Moderate Low     (RETIRED) Patient Fall Risk Level Moderate fall risk Low fall risk High fall risk Moderate fall risk Moderate fall risk   Functional Status Survey:    Vitals:   06/04/23 0917  BP: 118/84  Pulse: 90  Resp: 18  Temp: 97.9 F (36.6 C)  SpO2: 93%  Weight: 137 lb 9.6 oz (62.4 kg)  Height: 5\' 3"  (1.6 m)   Body mass index is 24.37 kg/m. Physical Exam Constitutional:      Appearance: Normal appearance.  Cardiovascular:     Rate and Rhythm: Normal rate and regular rhythm.     Pulses: Normal pulses.     Heart sounds: Normal heart sounds.  Pulmonary:     Effort: Pulmonary effort is normal.  Abdominal:     General: Abdomen is flat. Bowel sounds are normal.     Palpations: Abdomen is soft.  Musculoskeletal:        General: Swelling present. No tenderness.  Skin:    General: Skin is warm and dry.  Neurological:     Mental Status: She is alert. Mental status is at baseline.     Comments: Using rollator for ambulation  Psychiatric:        Mood and Affect: Mood normal.     Labs reviewed: Recent Labs    04/08/23 0000 05/06/23 0000 05/20/23 0000  NA 139 139 139  K 4.5 5.0 4.5  CL 103 106 102  CO2 27* 23* 29*  BUN 29* 25* 37*  CREATININE 1.3* 1.3* 1.5*  CALCIUM 9.3 9.2 8.9    Recent Labs    11/19/22 0000  AST 12*  ALT 9  ALKPHOS 90  ALBUMIN 3.7   Recent Labs    11/19/22 0000 03/08/23 0000 04/08/23 0000 05/06/23 0000  WBC 5.6 5.3 8.5 6.0  NEUTROABS 3,315.00 3,694.00  --   --   HGB 13.2 14.5 13.3 12.8  HCT 40 45 40 40  PLT 197 215 210 198   Lab Results  Component Value Date   TSH 2.07 03/08/2023   Lab Results  Component Value Date   HGBA1C 5.5 02/03/2015   No results  found for: "CHOL", "HDL", "LDLCALC", "LDLDIRECT", "TRIG", "CHOLHDL"  Significant Diagnostic Results in last 30 days:  No results found.  Assessment/Plan Longstanding persistent atrial fibrillation (HCC)  Primary hypertension  Severe aortic stenosis  Vascular dementia without behavioral disturbance, psychotic disturbance, mood disturbance, or anxiety, unspecified dementia severity (HCC) Patient with history of atrial fibrillation on AC and rate controlled. Appears stable at this time. BP at goal. Some fluid retention secondary to aortic stensis. Encourage compression. Continue lasix at this time. Electrolytes within goal range. Dementia FAST stage 6c with more and more urinary incontinence.   Family/ staff Communication: nursing  Labs/tests ordered: q1mo or prn

## 2023-06-05 ENCOUNTER — Encounter: Payer: Self-pay | Admitting: Student

## 2023-06-14 ENCOUNTER — Non-Acute Institutional Stay (SKILLED_NURSING_FACILITY): Payer: Self-pay | Admitting: Student

## 2023-06-14 ENCOUNTER — Encounter: Payer: Self-pay | Admitting: Student

## 2023-06-14 DIAGNOSIS — I5032 Chronic diastolic (congestive) heart failure: Secondary | ICD-10-CM | POA: Diagnosis not present

## 2023-06-14 DIAGNOSIS — I4811 Longstanding persistent atrial fibrillation: Secondary | ICD-10-CM | POA: Diagnosis not present

## 2023-06-14 NOTE — Progress Notes (Signed)
 Location:  Other Nursing Home Room Number: [facility] [address] Place of Service:  SNF [phone]) Provider:  Alver Jobs, Lisa Rideau, MD  Patient Care Team: Valrie Gehrig, MD as PCP - General (Family Medicine) Verma Gobble, NP as Nurse Practitioner (Geriatric Medicine)  Extended Emergency Contact Information Primary Emergency Contact: Baby Bolt Address: [place] [address] [place] Home Phone: [phone] Mobile Phone: [phone] Relation: Daughter Secondary Emergency Contact: eisenhauer,ruth  [place] Home Phone: [phone] Mobile Phone: [phone] Relation: Daughter  Code Status:  DNR Goals of care: Advanced Directive information    04/07/2023    1:22 PM  Advanced Directives  Does Patient Have a Medical Advance Directive? Yes  Type of Advance Directive Out of facility DNR (pink MOST or yellow form)  Does patient want to make changes to medical advance directive? No - Patient declined     Chief Complaint  Patient presents with   Acute Visit    DOE    HPI:  Pt is a 89 y.o. female seen today for an acute visit for dyspnea on exertion. Patient is smiling. States she is fine and wants to go for a walk, she has always liked exercise. She does get short of breath, but is able to sustain exercise.    Past Medical History:  Diagnosis Date   Cancer (HCC) skin   Cataract    Hearing loss    Osteoporosis    Vascular dementia [facility])    Past Surgical History:  Procedure Laterality Date   BREAST BIOPSY Left neg   BREAST CYST ASPIRATION Bilateral    EYE SURGERY Bilateral    HERNIA REPAIR     INTRAMEDULLARY (IM) NAIL INTERTROCHANTERIC Left 02/02/2015   Procedure: INTRAMEDULLARY (IM) NAIL INTERTROCHANTRIC;  Surgeon: Cesario Collum, MD;  Location: [facility] ORS;  Service: Orthopedics;  Laterality: Left;    Allergies  Allergen Reactions   Simvastatin     Other reaction(s): Muscle Pain   Levofloxacin Rash   Penicillins  Rash    Has patient had a PCN reaction causing immediate rash, facial/tongue/throat swelling, SOB or lightheadedness with hypotension: Yes Has patient had a PCN reaction causing severe rash involving mucus membranes or skin necrosis: No Has patient had a PCN reaction that required hospitalization Yes Has patient had a PCN reaction occurring within the last 10 years: No If all of the above answers are "NO", then may proceed with Cephalosporin use.    Outpatient Encounter Medications as of 06/14/2023  Medication Sig   acetaminophen  (TYLENOL ) 650 MG CR tablet Take 650 mg by mouth in the morning and at bedtime.   cyanocobalamin (VITAMIN B12) 500 MCG tablet Take 500 mcg by mouth daily.   furosemide (LASIX) 40 MG tablet Take 40 mg by mouth daily. As needed for weight gain 3lbs overnight or 5lbs in one week.   ipratropium-albuterol (DUONEB) 0.5-2.5 (3) MG/3ML SOLN Take 3 mLs by nebulization every 6 (six) hours as needed.   lisinopril (ZESTRIL) 5 MG tablet Take 5 mg by mouth daily.   OXYGEN Inhale into the lungs. 2lpm   polyethylene glycol (MIRALAX / GLYCOLAX) 17 g packet Take 17 g by mouth daily.   senna (SENOKOT) 8.6 MG tablet Take 1 tablet by mouth daily.   [DISCONTINUED] metoprolol succinate (TOPROL-XL) 25 MG 24 hr tablet Take 25 mg by mouth daily.   No facility-administered encounter medications on file as of 06/14/2023.    Review of Systems  Immunization  History  Administered Date(s) Administered   Influenza-Unspecified 11/21/2019, 11/19/2020, 11/19/2021, 11/20/2022   Moderna Covid-19 Fall Seasonal Vaccine 53yrs & older 05/12/2022, 05/14/2023   Moderna Sars-Covid-2 Vaccination 02/16/2019, 03/16/2019, 12/15/2019, 12/12/2021, 05/12/2022   Pneumococcal-Unspecified 09/04/1991   Unspecified SARS-COV-2 Vaccination 12/15/2019, 06/21/2020, 10/25/2020, 07/01/2021, 12/17/2022   Pertinent  Health Maintenance Due  Topic Date Due   DEXA SCAN  Never done   INFLUENZA VACCINE  09/03/2023       05/18/2019   10:00 AM 08/07/2020    3:35 PM 10/18/2020    3:41 PM 10/19/2020   12:49 PM 06/14/2023    8:46 PM  Fall Risk  Falls in the past year? 0    0  Was there an injury with Fall? 0    0  Fall Risk Category Calculator 0    0  Fall Risk Category (Retired) Low      (RETIRED) Patient Fall Risk Level Low fall risk High fall risk Moderate fall risk Moderate fall risk    Functional Status Survey:    Vitals:   06/14/23 2054  BP: 116/73  Pulse: 90  Resp: 18  SpO2: 91%  Weight: 136 lb (61.7 kg)   Body mass index is 24.09 kg/m. Physical Exam Pulmonary:     Comments: Increased WOB Neurological:     General: No focal deficit present.     Mental Status: She is alert.     Labs reviewed: Recent Labs    04/08/23 0000 05/06/23 0000 05/20/23 0000  NA 139 139 139  K 4.5 5.0 4.5  CL 103 106 102  CO2 27* 23* 29*  BUN 29* 25* 37*  CREATININE 1.3* 1.3* 1.5*  CALCIUM 9.3 9.2 8.9   Recent Labs    11/19/22 0000  AST 12*  ALT 9  ALKPHOS 90  ALBUMIN 3.7   Recent Labs    11/19/22 0000 03/08/23 0000 04/08/23 0000 05/06/23 0000  WBC 5.6 5.3 8.5 6.0  NEUTROABS 3,315.00 3,694.00  --   --   HGB 13.2 14.5 13.3 12.8  HCT 40 45 40 40  PLT 197 215 210 198   Lab Results  Component Value Date   TSH 2.07 03/08/2023   Lab Results  Component Value Date   HGBA1C 5.5 02/03/2015   No results found for: "CHOL", "HDL", "LDLCALC", "LDLDIRECT", "TRIG", "CHOLHDL"  Significant Diagnostic Results in last 30 days:  No results found.  Assessment/Plan Chronic heart failure with preserved ejection fraction (HCC)  Longstanding persistent atrial fibrillation (HCC) Patient with hx of HFpEF and atrial fibrillation. She has DOE and for symptomatic improvement will discontinue metoprolol at this time. If improved and HR increased to >90 will start diltiazem.   Family/ staff Communication: nursing  Labs/tests ordered:  none

## 2023-07-13 ENCOUNTER — Encounter: Payer: Self-pay | Admitting: Nurse Practitioner

## 2023-07-13 ENCOUNTER — Non-Acute Institutional Stay (SKILLED_NURSING_FACILITY): Payer: Self-pay | Admitting: Nurse Practitioner

## 2023-07-13 DIAGNOSIS — Z0000 Encounter for general adult medical examination without abnormal findings: Secondary | ICD-10-CM | POA: Diagnosis not present

## 2023-07-13 NOTE — Progress Notes (Signed)
 Subjective:   Bridget Nolan is a 89 y.o. female who presents for Medicare Annual (Subsequent) preventive examination.  Visit Complete: In person [facility]    Cardiac Risk Factors include: advanced age (>52men, >74 women);hypertension     Objective:     Today's Vitals   07/13/23 1109  BP: 133/84  Pulse: 88  Resp: 18  Temp: 97.9 F (36.6 C)  SpO2: 94%  Weight: 141 lb 9.6 oz (64.2 kg)  Height: 5\' 3"  (1.6 m)   Body mass index is 25.08 kg/m.     04/07/2023    1:22 PM 11/17/2022    3:00 PM 11/10/2022    1:23 PM 08/12/2022    9:44 AM 07/01/2022   10:31 AM 05/19/2022   12:05 PM 02/18/2022    8:47 AM  Advanced Directives  Does Patient Have a Medical Advance Directive? Yes Yes Yes Yes Yes No Yes  Type of Advance Directive Out of facility DNR (pink MOST or yellow form) Out of facility DNR (pink MOST or yellow form) Out of facility DNR (pink MOST or yellow form) Out of facility DNR (pink MOST or yellow form) Out of facility DNR (pink MOST or yellow form)  Out of facility DNR (pink MOST or yellow form)  Does patient want to make changes to medical advance directive? No - Patient declined No - Patient declined No - Patient declined No - Patient declined No - Patient declined  No - Patient declined  Would patient like information on creating a medical advance directive?      No - Patient declined No - Patient declined    Current Medications (verified) Outpatient Encounter Medications as of 07/13/2023  Medication Sig   acetaminophen  (TYLENOL ) 650 MG CR tablet Take 650 mg by mouth in the morning and at bedtime.   cyanocobalamin (VITAMIN B12) 500 MCG tablet Take 500 mcg by mouth daily.   furosemide (LASIX) 40 MG tablet Take 40 mg by mouth daily. As needed for weight gain 3lbs overnight or 5lbs in one week.   ipratropium-albuterol (DUONEB) 0.5-2.5 (3) MG/3ML SOLN Take 3 mLs by nebulization every 6 (six) hours as needed.   lisinopril (ZESTRIL) 5 MG tablet Take 5 mg by mouth  daily.   OXYGEN Inhale into the lungs. 2lpm   polyethylene glycol (MIRALAX / GLYCOLAX) 17 g packet Take 17 g by mouth daily.   senna (SENOKOT) 8.6 MG tablet Take 1 tablet by mouth daily.   No facility-administered encounter medications on file as of 07/13/2023.    Allergies (verified) Simvastatin, Levofloxacin, and Penicillins   History: Past Medical History:  Diagnosis Date   Cancer (HCC) skin   Cataract    Hearing loss    Osteoporosis    Vascular dementia (HCC)    Past Surgical History:  Procedure Laterality Date   BREAST BIOPSY Left neg   BREAST CYST ASPIRATION Bilateral    EYE SURGERY Bilateral    HERNIA REPAIR     INTRAMEDULLARY (IM) NAIL INTERTROCHANTERIC Left 02/02/2015   Procedure: INTRAMEDULLARY (IM) NAIL INTERTROCHANTRIC;  Surgeon: Cesario Collum, MD;  Location: [facility] ORS;  Service: Orthopedics;  Laterality: Left;   Family History  Problem Relation Age of Onset   Thyroid cancer Mother    Kidney disease Brother    Diabetes Neg Hx    Social History   Socioeconomic History   Marital status: Widowed    Spouse name: Not on file   Number of children: 2   Years of education: Not  on file   Highest education level: Not on file  Occupational History   Occupation: Housewife  Tobacco Use   Smoking status: Former    Current packs/day: 0.00    Types: Cigarettes    Quit date: 02/1943    Years since quitting: 80.4   Smokeless tobacco: Never   Tobacco comments:    Social Smoker  Vaping Use   Vaping status: Never Used  Substance and Sexual Activity   Alcohol use: Yes    Comment: occ   Drug use: Never   Sexual activity: Not on file  Other Topics Concern   Not on file  Social History Narrative   Widowed ~2008   2 daughters   Has living will   Grandson Elisabeth Guild is financial manager---?daughter for health care Lorelee Roger or Henry Loge)   Requests DNR---done 09/14/19   Social Drivers of Health   Financial Resource Strain: Not on file  Food Insecurity: Not on file   Transportation Needs: Not on file  Physical Activity: Not on file  Stress: Not on file  Social Connections: Not on file    Tobacco Counseling Counseling given: Not Answered Tobacco comments: Social Smoker   Clinical Intake:  Pre-visit preparation completed: Yes  Pain : No/denies pain     BMI - recorded: 25 Nutritional Status: BMI of 19-24  Normal Diabetes: No  How often do you need to have someone help you when you read instructions, pamphlets, or other written materials from your doctor or pharmacy?: 5 - Always         Activities of Daily Living    07/13/2023    1:10 PM  In your present state of health, do you have any difficulty performing the following activities:  Hearing? 1  Vision? 0  Difficulty concentrating or making decisions? 1  Walking or climbing stairs? 0  Dressing or bathing? 1  Doing errands, shopping? 1  Preparing Food and eating ? Y  Using the Toilet? N  In the past six months, have you accidently leaked urine? N  Do you have problems with loss of bowel control? N  Managing your Medications? Y  Managing your Finances? Y  Housekeeping or managing your Housekeeping? Y    Patient Care Team: Valrie Gehrig, MD as PCP - General (Family Medicine) Verma Gobble, NP as Nurse Practitioner (Geriatric Medicine)  Indicate any recent Medical Services you may have received from other than [facility] providers in the past year (date may be approximate).     Assessment:    This is a routine wellness examination for Bridget Nolan.  Hearing/Vision screen No results found.   Goals Addressed   None    Depression Screen    07/13/2023    1:13 PM 05/18/2019   10:00 AM  PHQ 2/9 Scores  PHQ - 2 Score  0  Exception Documentation Other- indicate reason in comment box   Not completed dementia     Fall Risk    07/13/2023    1:13 PM 06/14/2023    8:46 PM 05/18/2019   10:00 AM 05/11/2019    3:49 PM 04/20/2019    9:01 AM  Fall Risk   Falls in the past year? 0  0 0 1 0  Number falls in past yr: 0 0 0 0   Injury with Fall?  0 0 1 0    MEDICARE RISK AT HOME:    TIMED UP AND GO:  Was the test performed?  No    Cognitive Function:  05/18/2019   10:01 AM  MMSE - Mini Mental State Exam  Orientation to time 5  Orientation to Place 5  Registration 3  Attention/ Calculation 4  Recall 0  Language- name 2 objects 2  Language- repeat 0  Language- follow 3 step command 3  Language- read & follow direction 1  Write a sentence 1  Copy design 0  Total score 24        Immunizations Immunization History  Administered Date(s) Administered   Influenza-Unspecified 11/21/2019, 11/19/2020, 11/19/2021, 11/20/2022   Moderna Covid-19 Fall Seasonal Vaccine 63yrs & older 05/12/2022, 05/14/2023   Moderna Sars-Covid-2 Vaccination 02/16/2019, 03/16/2019, 12/15/2019, 12/12/2021, 05/12/2022   Pneumococcal-Unspecified 09/04/1991   Unspecified SARS-COV-2 Vaccination 12/15/2019, 06/21/2020, 10/25/2020, 07/01/2021, 12/17/2022    TDAP status: Due, Education has been provided regarding the importance of this vaccine. Advised may receive this vaccine at local pharmacy or Health Dept. Aware to provide a copy of the vaccination record if obtained from local pharmacy or Health Dept. Verbalized acceptance and understanding.  Flu Vaccine status: Up to date  Pneumococcal vaccine status: Due, Education has been provided regarding the importance of this vaccine. Advised may receive this vaccine at local pharmacy or Health Dept. Aware to provide a copy of the vaccination record if obtained from local pharmacy or Health Dept. Verbalized acceptance and understanding.  Covid-19 vaccine status: Completed vaccines  Qualifies for Shingles Vaccine? Yes   Zostavax completed No   Shingrix Completed?: No.    Education has been provided regarding the importance of this vaccine. Patient has been advised to call insurance company to determine out of pocket expense if they have not  yet received this vaccine. Advised may also receive vaccine at local pharmacy or Health Dept. Verbalized acceptance and understanding.  Screening Tests Health Maintenance  Topic Date Due   DTaP/Tdap/Td (1 - Tdap) Never done   Pneumonia Vaccine 81+ Years old (1 of 2 - PCV) 09/04/1940   Zoster Vaccines- Shingrix (1 of 2) Never done   COVID-19 Vaccine (12 - 2024-25 season) 07/09/2023   INFLUENZA VACCINE  09/03/2023   Medicare Annual Wellness (AWV)  07/12/2024   HPV VACCINES  Aged Out   Meningococcal B Vaccine  Aged Out   DEXA SCAN  Discontinued    Health Maintenance  Health Maintenance Due  Topic Date Due   DTaP/Tdap/Td (1 - Tdap) Never done   Pneumonia Vaccine 81+ Years old (1 of 2 - PCV) 09/04/1940   Zoster Vaccines- Shingrix (1 of 2) Never done   COVID-19 Vaccine (12 - 2024-25 season) 07/09/2023    Colorectal cancer screening: No longer required.   Mammogram status: No longer required due to age.  Lung Cancer Screening: (Low Dose CT Chest recommended if Age 40-80 years, 20 pack-year currently smoking OR have quit w/in 15years.) does not qualify.   Lung Cancer Screening Referral: na  Additional Screening:  Hepatitis C Screening: does not qualify; Completed na  Vision Screening: Recommended annual ophthalmology exams for early detection of glaucoma and other disorders of the eye. Is the patient up to date with their annual eye exam?  Yes  Who is the provider or what is the name of the office in which the patient attends annual eye exams? [facility]  If pt is not established with a provider, would they like to be referred to a provider to establish care? No .   Dental Screening: Recommended annual dental exams for proper oral hygiene   Community Resource Referral / Chronic Care Management: CRR  required this visit?  No   CCM required this visit?  No     Plan:     I have personally reviewed and noted the following in the patient's chart:   Medical and social  history Use of alcohol, tobacco or illicit drugs  Current medications and supplements including opioid prescriptions. Patient is not currently taking opioid prescriptions. Functional ability and status Nutritional status Physical activity Advanced directives List of other physicians Hospitalizations, surgeries, and ER visits in previous 12 months Vitals Screenings to include cognitive, depression, and falls Referrals and appointments  In addition, I have reviewed and discussed with patient certain preventive protocols, quality metrics, and best practice recommendations. A written personalized care plan for preventive services as well as general preventive health recommendations were provided to patient.     Verma Gobble, NP   07/13/2023

## 2023-07-20 ENCOUNTER — Non-Acute Institutional Stay (SKILLED_NURSING_FACILITY): Payer: Self-pay | Admitting: Nurse Practitioner

## 2023-07-20 ENCOUNTER — Encounter: Payer: Self-pay | Admitting: Nurse Practitioner

## 2023-07-20 DIAGNOSIS — F0150 Vascular dementia without behavioral disturbance: Secondary | ICD-10-CM

## 2023-07-20 DIAGNOSIS — I10 Essential (primary) hypertension: Secondary | ICD-10-CM

## 2023-07-20 DIAGNOSIS — I350 Nonrheumatic aortic (valve) stenosis: Secondary | ICD-10-CM

## 2023-07-20 DIAGNOSIS — I5032 Chronic diastolic (congestive) heart failure: Secondary | ICD-10-CM | POA: Diagnosis not present

## 2023-07-20 DIAGNOSIS — N1832 Chronic kidney disease, stage 3b: Secondary | ICD-10-CM

## 2023-07-20 DIAGNOSIS — L03115 Cellulitis of right lower limb: Secondary | ICD-10-CM

## 2023-07-20 NOTE — Assessment & Plan Note (Signed)
 Chronic and stable Encourage proper hydration Follow metabolic panel Avoid nephrotoxic meds (NSAIDS)

## 2023-07-20 NOTE — Assessment & Plan Note (Signed)
 Ongoing and stable, without significant changes.  Continues support with staff.

## 2023-07-20 NOTE — Assessment & Plan Note (Signed)
 Euvolemic at this time, continues on lasix 40 mg daily

## 2023-07-20 NOTE — Assessment & Plan Note (Signed)
 Stable, continues on lasix daily

## 2023-07-20 NOTE — Progress Notes (Signed)
 Location:  Other [facility].  Nursing Home Room Number: [facility] [address] Place of Service:  SNF [phone]) Gilbert Lab, NP  PCP: Valrie Gehrig, MD  Patient Care Team: Valrie Gehrig, MD as PCP - General (Family Medicine) Verma Gobble, NP as Nurse Practitioner (Geriatric Medicine)  Extended Emergency Contact Information Primary Emergency Contact: Baby Bolt Address: [place] [address] [place] Home Phone: [phone] Mobile Phone: [phone] Relation: Daughter Secondary Emergency Contact: eisenhauer,ruth  [place] Home Phone: [phone] Mobile Phone: [phone] Relation: Daughter  Goals of care: Advanced Directive information    07/20/2023   12:15 PM  Advanced Directives  Does Patient Have a Medical Advance Directive? Yes  Type of Advance Directive Out of facility DNR (pink MOST or yellow form)  Does patient want to make changes to medical advance directive? No - Patient declined     Chief Complaint  Patient presents with   Medical Management of Chronic Issues    Medical Management of Chronic Issues.     HPI:  Pt is a 89 y.o. female seen today for medical management of chronic disease.  Pt with hx of dementia, OP, CHF.  She had recent skin tear that opened up and nursing treating. No with increase in tenderness and redness noted. Some increase in edema noted to right leg.  Staff monitoring weights daily due to CHF and giving additional lasix if needed for greater than 3 lb weight gain Overall weight has been stable.  Reports she is always short of breath with exertion. No changes in this.  No significant increase in LE edema.   Past Medical History:  Diagnosis Date   Cancer (HCC) skin   Cataract    Hearing loss    Osteoporosis    Vascular dementia [facility])    Past Surgical History:  Procedure Laterality Date   BREAST BIOPSY Left neg   BREAST CYST ASPIRATION Bilateral    EYE SURGERY Bilateral     HERNIA REPAIR     INTRAMEDULLARY (IM) NAIL INTERTROCHANTERIC Left 02/02/2015   Procedure: INTRAMEDULLARY (IM) NAIL INTERTROCHANTRIC;  Surgeon: Cesario Collum, MD;  Location: [facility] ORS;  Service: Orthopedics;  Laterality: Left;    Allergies  Allergen Reactions   Simvastatin     Other reaction(s): Muscle Pain   Levofloxacin Rash   Penicillins Rash    Has patient had a PCN reaction causing immediate rash, facial/tongue/throat swelling, SOB or lightheadedness with hypotension: Yes Has patient had a PCN reaction causing severe rash involving mucus membranes or skin necrosis: No Has patient had a PCN reaction that required hospitalization Yes Has patient had a PCN reaction occurring within the last 10 years: No If all of the above answers are NO, then may proceed with Cephalosporin use.    Outpatient Encounter Medications as of 07/20/2023  Medication Sig   acetaminophen  (TYLENOL ) 650 MG CR tablet Take 650 mg by mouth in the morning and at bedtime.   cyanocobalamin (VITAMIN B12) 500 MCG tablet Take 500 mcg by mouth daily.   furosemide (LASIX) 40 MG tablet Take 40 mg by mouth daily. As needed for weight gain 3lbs overnight or 5lbs in one week.   ipratropium-albuterol (DUONEB) 0.5-2.5 (3) MG/3ML SOLN Take 3 mLs by nebulization every 6 (six) hours as needed.   lisinopril (ZESTRIL) 5 MG tablet Take 5 mg by mouth daily.   OXYGEN Inhale into the lungs. 2lpm   polyethylene glycol (MIRALAX / GLYCOLAX) 17  g packet Take 17 g by mouth every other day.   senna (SENOKOT) 8.6 MG tablet Take 1 tablet by mouth daily.   No facility-administered encounter medications on file as of 07/20/2023.    Review of Systems  Constitutional:  Negative for activity change, appetite change, fatigue and unexpected weight change.  HENT:  Negative for congestion and hearing loss.   Eyes: Negative.   Respiratory:  Positive for shortness of breath. Negative for cough.   Cardiovascular:  Positive for leg swelling. Negative  for chest pain and palpitations.  Gastrointestinal:  Negative for abdominal pain, constipation and diarrhea.  Genitourinary:  Negative for difficulty urinating and dysuria.  Musculoskeletal:  Negative for arthralgias and myalgias.  Skin:  Negative for color change and wound.  Neurological:  Negative for dizziness and weakness.  Psychiatric/Behavioral:  Positive for confusion. Negative for agitation and behavioral problems.      Immunization History  Administered Date(s) Administered   Influenza-Unspecified 11/21/2019, 11/19/2020, 11/19/2021, 11/20/2022   Moderna Covid-19 Fall Seasonal Vaccine 48yrs & older 05/12/2022, 05/14/2023   Moderna Sars-Covid-2 Vaccination 02/16/2019, 03/16/2019, 12/15/2019, 12/12/2021, 05/12/2022   Pneumococcal-Unspecified 09/04/1991   Unspecified SARS-COV-2 Vaccination 12/15/2019, 06/21/2020, 10/25/2020, 07/01/2021, 12/17/2022   Zoster Recombinant(Shingrix) 07/15/2023   Pertinent  Health Maintenance Due  Topic Date Due   INFLUENZA VACCINE  09/03/2023   DEXA SCAN  Discontinued      08/07/2020    3:35 PM 10/18/2020    3:41 PM 10/19/2020   12:49 PM 06/14/2023    8:46 PM 07/13/2023    1:13 PM  Fall Risk  Falls in the past year?    0 0  Was there an injury with Fall?    0   Fall Risk Category Calculator    0   (RETIRED) Patient Fall Risk Level High fall risk  Moderate fall risk  Moderate fall risk        Data saved with a previous flowsheet row definition   Functional Status Survey:    Vitals:   07/20/23 1210  BP: 116/61  Pulse: 82  Resp: 18  Temp: 97.9 F (36.6 C)  SpO2: 94%  Weight: 142 lb 6.4 oz (64.6 kg)  Height: 5' 3 (1.6 m)   Body mass index is 25.23 kg/m. Physical Exam Constitutional:      General: She is not in acute distress.    Appearance: She is well-developed. She is not diaphoretic.  HENT:     Head: Normocephalic and atraumatic.     Mouth/Throat:     Pharynx: No oropharyngeal exudate.   Eyes:     Conjunctiva/sclera:  Conjunctivae normal.     Pupils: Pupils are equal, round, and reactive to light.    Cardiovascular:     Rate and Rhythm: Normal rate and regular rhythm.     Heart sounds: Normal heart sounds.  Pulmonary:     Effort: Pulmonary effort is normal.     Breath sounds: Normal breath sounds.  Abdominal:     General: Bowel sounds are normal.     Palpations: Abdomen is soft.   Musculoskeletal:     Cervical back: Normal range of motion and neck supple.     Right lower leg: Edema present.     Left lower leg: No edema.   Skin:    General: Skin is warm and dry.     Findings: Erythema (noted to right leg around skin tear, with warmth and tenderness) present.   Neurological:     Mental Status: She is alert.  Psychiatric:        Mood and Affect: Mood normal.     Labs reviewed: Recent Labs    04/08/23 0000 05/06/23 0000 05/20/23 0000  NA 139 139 139  K 4.5 5.0 4.5  CL 103 106 102  CO2 27* 23* 29*  BUN 29* 25* 37*  CREATININE 1.3* 1.3* 1.5*  CALCIUM 9.3 9.2 8.9   Recent Labs    11/19/22 0000  AST 12*  ALT 9  ALKPHOS 90  ALBUMIN 3.7   Recent Labs    11/19/22 0000 03/08/23 0000 04/08/23 0000 05/06/23 0000  WBC 5.6 5.3 8.5 6.0  NEUTROABS 3,315.00 3,694.00  --   --   HGB 13.2 14.5 13.3 12.8  HCT 40 45 40 40  PLT 197 215 210 198   Lab Results  Component Value Date   TSH 2.07 03/08/2023   Lab Results  Component Value Date   HGBA1C 5.5 02/03/2015   No results found for: CHOL, HDL, LDLCALC, LDLDIRECT, TRIG, CHOLHDL  Significant Diagnostic Results in last 30 days:  No results found.  Assessment/Plan (HFpEF) heart failure with preserved ejection fraction (HCC) Euvolemic at this time, continues on lasix 40 mg daily   HTN (hypertension) Blood pressure well controlled, goal bp <140/90 Continue current medications and dietary modifications follow metabolic panel  Cellulitis Noted to right lower leg after skin tear Continue wound care per  nursing Start doxycycline  100 mg by mouth BID for 7 days  Severe aortic stenosis Stable, continues on lasix daily   Stage 3b chronic kidney disease (HCC) Chronic and stable Encourage proper hydration Follow metabolic panel Avoid nephrotoxic meds (NSAIDS)  Vascular dementia (HCC) Ongoing and stable, without significant changes.  Continues support with staff.      Chaitra Mast K. Denney Fisherman [facility] [phone]

## 2023-07-20 NOTE — Assessment & Plan Note (Signed)
 Blood pressure well controlled, goal bp <140/90 Continue current medications and dietary modifications follow metabolic panel

## 2023-07-20 NOTE — Assessment & Plan Note (Signed)
 Rate controlled

## 2023-09-08 ENCOUNTER — Encounter: Payer: Self-pay | Admitting: Student

## 2023-09-08 ENCOUNTER — Non-Acute Institutional Stay (SKILLED_NURSING_FACILITY): Payer: Self-pay | Admitting: Student

## 2023-09-08 DIAGNOSIS — I10 Essential (primary) hypertension: Secondary | ICD-10-CM

## 2023-09-08 DIAGNOSIS — F0150 Vascular dementia without behavioral disturbance: Secondary | ICD-10-CM | POA: Diagnosis not present

## 2023-09-08 DIAGNOSIS — I5032 Chronic diastolic (congestive) heart failure: Secondary | ICD-10-CM | POA: Diagnosis not present

## 2023-09-08 NOTE — Progress Notes (Signed)
 Location:  Other [place].  Nursing Home Room Number: [facility] [id number] Place of Service:  SNF [phone]) Provider:  Abdul Fine, MD  Patient Care Team: Abdul Fine, MD as PCP - General (Family Medicine) Caro Harlene POUR, NP as Nurse Practitioner (Geriatric Medicine)  Extended Emergency Contact Information Primary Emergency Contact: Gwyneth Dragon Address: [place] [address] [place] Home Phone: [phone] Mobile Phone: [phone] Relation: Daughter Secondary Emergency Contact: eisenhauer,ruth  [place] Home Phone: [phone] Mobile Phone: [phone] Relation: Daughter  Code Status:  DNR Goals of care: Advanced Directive information    07/20/2023   12:15 PM  Advanced Directives  Does Patient Have a Medical Advance Directive? Yes  Type of Advance Directive Out of facility DNR (pink MOST or yellow form)  Does patient want to make changes to medical advance directive? No - Patient declined     Chief Complaint  Patient presents with   Medical Management of Chronic Issues    Medical Management of Chronic Issues.     HPI:  Pt is a 89 y.o. female seen today for medical management of chronic diseases.   History of Present Illness The patient, with heart failure with preserved ejection fraction, presents with dyspnea on exertion for follow-up.  She experiences chronic dyspnea on exertion, describing her breathing as 'horrible' during any activity. Despite this, she does not express significant concern about her breathing difficulties.  Regarding her leg, she is unsure about the current status of a wound.  Past Medical History - Heart failure with preserved ejection fraction - Dyspnea on exertion   Past Medical History:  Diagnosis Date   Cancer (HCC) skin   Cataract    Hearing loss    Osteoporosis    Vascular dementia (HCC)    Past Surgical History:  Procedure Laterality Date   BREAST BIOPSY Left  neg   BREAST CYST ASPIRATION Bilateral    EYE SURGERY Bilateral    HERNIA REPAIR     INTRAMEDULLARY (IM) NAIL INTERTROCHANTERIC Left 02/02/2015   Procedure: INTRAMEDULLARY (IM) NAIL INTERTROCHANTRIC;  Surgeon: Livingston ONEIDA Mori, MD;  Location: [facility] ORS;  Service: Orthopedics;  Laterality: Left;    Allergies  Allergen Reactions   Simvastatin     Other reaction(s): Muscle Pain   Levofloxacin Rash   Penicillins Rash    Has patient had a PCN reaction causing immediate rash, facial/tongue/throat swelling, SOB or lightheadedness with hypotension: Yes Has patient had a PCN reaction causing severe rash involving mucus membranes or skin necrosis: No Has patient had a PCN reaction that required hospitalization Yes Has patient had a PCN reaction occurring within the last 10 years: No If all of the above answers are NO, then may proceed with Cephalosporin use.    Outpatient Encounter Medications as of 09/08/2023  Medication Sig   acetaminophen  (TYLENOL ) 650 MG CR tablet Take 650 mg by mouth in the morning and at bedtime.   cyanocobalamin (VITAMIN B12) 500 MCG tablet Take 500 mcg by mouth daily.   furosemide (LASIX) 40 MG tablet Take 40 mg by mouth daily. As needed for weight gain 3lbs overnight or 5lbs in one week.   ipratropium-albuterol (DUONEB) 0.5-2.5 (3) MG/3ML SOLN Take 3 mLs by nebulization every 6 (six) hours as needed.   lisinopril (ZESTRIL) 5 MG tablet Take 5 mg by mouth daily.   polyethylene glycol (MIRALAX / GLYCOLAX) 17 g packet Take 17 g by mouth every other day.  senna (SENOKOT) 8.6 MG tablet Take 1 tablet by mouth daily.   OXYGEN Inhale into the lungs. 2lpm (Patient not taking: Reported on 09/08/2023)   No facility-administered encounter medications on file as of 09/08/2023.    Review of Systems  Immunization History  Administered Date(s) Administered   Influenza-Unspecified 11/21/2019, 11/19/2020, 11/19/2021, 11/20/2022   Moderna Covid-19 Fall Seasonal Vaccine 73yrs & older  05/12/2022, 05/14/2023   Moderna Sars-Covid-2 Vaccination 02/16/2019, 03/16/2019, 12/15/2019, 12/12/2021, 05/12/2022   Pneumococcal-Unspecified 09/04/1991   Tdap 08/14/2023   Unspecified SARS-COV-2 Vaccination 12/15/2019, 06/21/2020, 10/25/2020, 07/01/2021, 12/17/2022   Zoster Recombinant(Shingrix) 07/15/2023   Pertinent  Health Maintenance Due  Topic Date Due   INFLUENZA VACCINE  09/03/2023   DEXA SCAN  Discontinued      08/07/2020    3:35 PM 10/18/2020    3:41 PM 10/19/2020   12:49 PM 06/14/2023    8:46 PM 07/13/2023    1:13 PM  Fall Risk  Falls in the past year?    0 0  Was there an injury with Fall?    0   Fall Risk Category Calculator    0   (RETIRED) Patient Fall Risk Level High fall risk  Moderate fall risk  Moderate fall risk        Data saved with a previous flowsheet row definition   Functional Status Survey:    Vitals:   09/08/23 0940  BP: 122/60  Pulse: 65  Resp: 17  Temp: 97.6 F (36.4 C)  SpO2: 94%  Weight: 145 lb 6.4 oz (66 kg)  Height: 5' 3 (1.6 m)   Body mass index is 25.76 kg/m. Physical Exam CHEST: Lungs clear to auscultation bilaterally. SKIN: Minimal swelling. Skin tear dried up fully.  Labs reviewed: Recent Labs    04/08/23 0000 05/06/23 0000 05/20/23 0000  NA 139 139 139  K 4.5 5.0 4.5  CL 103 106 102  CO2 27* 23* 29*  BUN 29* 25* 37*  CREATININE 1.3* 1.3* 1.5*  CALCIUM 9.3 9.2 8.9   Recent Labs    11/19/22 0000  AST 12*  ALT 9  ALKPHOS 90  ALBUMIN 3.7   Recent Labs    11/19/22 0000 03/08/23 0000 04/08/23 0000 05/06/23 0000  WBC 5.6 5.3 8.5 6.0  NEUTROABS 3,315.00 3,694.00  --   --   HGB 13.2 14.5 13.3 12.8  HCT 40 45 40 40  PLT 197 215 210 198   Lab Results  Component Value Date   TSH 2.07 03/08/2023   Lab Results  Component Value Date   HGBA1C 5.5 02/03/2015   No results found for: CHOL, HDL, LDLCALC, LDLDIRECT, TRIG, CHOLHDL  Significant Diagnostic Results in last 30 days:  No results  found. Procedure: Wound assessment   Description: The laceration on the leg was evaluated. The wound appeared fully desiccated with no need for calcium alginate. Minimal edema was noted.  Assessment/Plan Hypertension Blood pressure control not explicitly addressed.  Heart failure with preserved ejection fraction Chronic dyspnea on exertion consistent with heart failure with preserved ejection fraction. No signs of volume overload. Lungs clear on auscultation, indicating no acute exacerbation.  Chronic leg wound, resolved Chronic leg wound resolved with minimal swelling. Wound fully dried, no need for calcium alginate dressing. - Apply Vaseline daily to maintain skin integrity.  Dementia No acute signs of cognitive decline at this time. Adjusting well to skilled facility. Continue supportive care.   Family/ staff Communication: nursing  Labs/tests ordered:  none

## 2023-09-16 ENCOUNTER — Encounter: Payer: Self-pay | Admitting: Student

## 2023-11-09 ENCOUNTER — Encounter: Payer: Self-pay | Admitting: Adult Health

## 2023-11-09 ENCOUNTER — Non-Acute Institutional Stay (SKILLED_NURSING_FACILITY): Payer: Self-pay | Admitting: Adult Health

## 2023-11-09 DIAGNOSIS — I10 Essential (primary) hypertension: Secondary | ICD-10-CM | POA: Diagnosis not present

## 2023-11-09 DIAGNOSIS — N1832 Chronic kidney disease, stage 3b: Secondary | ICD-10-CM | POA: Diagnosis not present

## 2023-11-09 DIAGNOSIS — I5032 Chronic diastolic (congestive) heart failure: Secondary | ICD-10-CM | POA: Diagnosis not present

## 2023-11-09 DIAGNOSIS — K5901 Slow transit constipation: Secondary | ICD-10-CM | POA: Diagnosis not present

## 2023-11-09 NOTE — Progress Notes (Signed)
 Location:  Other ([facility]) Nursing Home Room Number: [id number] Place of Service:  SNF ([phone]) Provider:  Medina-Vargas, Commodore Bellew, DNP, FNP-BC  Patient Care Team: Abdul Fine, MD as PCP - General (Family Medicine) Caro Harlene POUR, NP as Nurse Practitioner (Geriatric Medicine)  Extended Emergency Contact Information Primary Emergency Contact: Gwyneth Dragon Address: [place] [address] [place] Home Phone: [phone] Mobile Phone: [phone] Relation: Daughter Secondary Emergency Contact: eisenhauer,ruth  [place] Home Phone: [phone] Mobile Phone: [phone] Relation: Daughter  Code Status:  DNR  Goals of care: Advanced Directive information    07/20/2023   12:15 PM  Advanced Directives  Does Patient Have a Medical Advance Directive? Yes  Type of Advance Directive Out of facility DNR (pink MOST or yellow form)  Does patient want to make changes to medical advance directive? No - Patient declined     Chief Complaint  Patient presents with   Medical Management of Chronic Issues    HPI:  Pt is a 89 y.o. female seen today for medical management of chronic diseases. She is a resident of [facility].   Pharmacy recommended Lisinopril to be decreased due to occasional low BP 94/55, 103/62, 116/67. She is currently taking Lisinopril 5 mg daily.  She takes Lasix 40 mg daily and Lasix 40 mg PRN for chronic heart failure. No reported shortness of breath.  She takes Lasix 70 mg every other day and senna 8.6 mg 1 tab daily for constipation    Past Medical History:  Diagnosis Date   Cancer (HCC) skin   Cataract    Hearing loss    Osteoporosis    Vascular dementia (HCC)    Past Surgical History:  Procedure Laterality Date   BREAST BIOPSY Left neg   BREAST CYST ASPIRATION Bilateral    EYE SURGERY Bilateral    HERNIA REPAIR     INTRAMEDULLARY (IM) NAIL INTERTROCHANTERIC Left 02/02/2015    Procedure: INTRAMEDULLARY (IM) NAIL INTERTROCHANTRIC;  Surgeon: Livingston ONEIDA Mori, MD;  Location: [facility] ORS;  Service: Orthopedics;  Laterality: Left;    Allergies  Allergen Reactions   Simvastatin     Other reaction(s): Muscle Pain   Levofloxacin Rash   Penicillins Rash    Has patient had a PCN reaction causing immediate rash, facial/tongue/throat swelling, SOB or lightheadedness with hypotension: Yes Has patient had a PCN reaction causing severe rash involving mucus membranes or skin necrosis: No Has patient had a PCN reaction that required hospitalization Yes Has patient had a PCN reaction occurring within the last 10 years: No If all of the above answers are NO, then may proceed with Cephalosporin use.    Outpatient Encounter Medications as of 11/09/2023  Medication Sig   acetaminophen  (TYLENOL ) 650 MG CR tablet Take 650 mg by mouth in the morning and at bedtime.   cyanocobalamin (VITAMIN B12) 500 MCG tablet Take 500 mcg by mouth daily.   furosemide (LASIX) 40 MG tablet Take 40 mg by mouth daily. As needed for weight gain 3lbs overnight or 5lbs in one week.   ipratropium-albuterol (DUONEB) 0.5-2.5 (3) MG/3ML SOLN Take 3 mLs by nebulization every 6 (six) hours as needed.   lisinopril (ZESTRIL) 5 MG tablet Take 5 mg by mouth daily.   OXYGEN Inhale into the lungs. 2lpm (Patient not taking: Reported on 09/08/2023)   polyethylene glycol (MIRALAX / GLYCOLAX) 17 g packet Take 17 g by mouth every other day.  senna (SENOKOT) 8.6 MG tablet Take 1 tablet by mouth daily.   No facility-administered encounter medications on file as of 11/09/2023.    Review of Systems  Constitutional:  Negative for appetite change, chills, fatigue and fever.  HENT:  Negative for congestion, hearing loss, rhinorrhea and sore throat.   Eyes: Negative.   Respiratory:  Negative for cough, shortness of breath and wheezing.   Cardiovascular:  Positive for leg swelling. Negative for chest pain and palpitations.   Gastrointestinal:  Negative for abdominal pain, constipation, diarrhea, nausea and vomiting.  Genitourinary:  Negative for dysuria.  Musculoskeletal:  Negative for arthralgias, back pain and myalgias.  Skin:  Negative for color change, rash and wound.  Neurological:  Negative for dizziness, weakness and headaches.  Psychiatric/Behavioral:  Negative for behavioral problems. The patient is not nervous/anxious.      Immunization History  Administered Date(s) Administered   Influenza-Unspecified 11/21/2019, 11/19/2020, 11/19/2021, 11/20/2022   Moderna Covid-19 Fall Seasonal Vaccine 27yrs & older 05/12/2022, 05/14/2023   Moderna Sars-Covid-2 Vaccination 02/16/2019, 03/16/2019, 12/15/2019, 12/12/2021, 05/12/2022   Pneumococcal-Unspecified 09/04/1991   Tdap 08/14/2023   Unspecified SARS-COV-2 Vaccination 12/15/2019, 06/21/2020, 10/25/2020, 07/01/2021, 12/17/2022   Zoster Recombinant(Shingrix) 07/15/2023   Pertinent  Health Maintenance Due  Topic Date Due   Influenza Vaccine  09/03/2023   Mammogram  Discontinued   DEXA SCAN  Discontinued      08/07/2020    3:35 PM 10/18/2020    3:41 PM 10/19/2020   12:49 PM 06/14/2023    8:46 PM 07/13/2023    1:13 PM  Fall Risk  Falls in the past year?    0 0  Was there an injury with Fall?    0   Fall Risk Category Calculator    0   (RETIRED) Patient Fall Risk Level High fall risk  Moderate fall risk  Moderate fall risk        Data saved with a previous flowsheet row definition     Vitals:   11/09/23 1723  BP: 114/68  Pulse: 84  Resp: 18  Temp: 97.8 F (36.6 C)  SpO2: 97%  Weight: 146 lb 12.8 oz (66.6 kg)  Height: 5' 3 (1.6 m)   Body mass index is 26 kg/m.  Physical Exam Constitutional:      General: She is not in acute distress. HENT:     Head: Normocephalic and atraumatic.     Nose: Nose normal.     Mouth/Throat:     Mouth: Mucous membranes are moist.  Eyes:     Conjunctiva/sclera: Conjunctivae normal.  Cardiovascular:      Rate and Rhythm: Normal rate and regular rhythm.  Pulmonary:     Effort: Pulmonary effort is normal.     Breath sounds: Normal breath sounds.  Abdominal:     General: Bowel sounds are normal.     Palpations: Abdomen is soft.  Musculoskeletal:        General: Swelling present. Normal range of motion.     Cervical back: Normal range of motion.     Right lower leg: Edema present.     Left lower leg: Edema present.     Comments: Bilateral lower extremity 1+edema.  Skin:    General: Skin is warm and dry.  Psychiatric:        Mood and Affect: Mood normal.        Behavior: Behavior normal.      Labs reviewed: Recent Labs    04/08/23 0000 05/06/23 0000 05/20/23 0000  NA  139 139 139  K 4.5 5.0 4.5  CL 103 106 102  CO2 27* 23* 29*  BUN 29* 25* 37*  CREATININE 1.3* 1.3* 1.5*  CALCIUM 9.3 9.2 8.9   Recent Labs    11/19/22 0000  AST 12*  ALT 9  ALKPHOS 90  ALBUMIN 3.7   Recent Labs    11/19/22 0000 03/08/23 0000 04/08/23 0000 05/06/23 0000  WBC 5.6 5.3 8.5 6.0  NEUTROABS 3,315.00 3,694.00  --   --   HGB 13.2 14.5 13.3 12.8  HCT 40 45 40 40  PLT 197 215 210 198   Lab Results  Component Value Date   TSH 2.07 03/08/2023   Lab Results  Component Value Date   HGBA1C 5.5 02/03/2015   No results found for: CHOL, HDL, LDLCALC, LDLDIRECT, TRIG, CHOLHDL  Significant Diagnostic Results in last 30 days:  No results found.  Assessment/Plan  1. Primary hypertension (Primary) -  has occasional low Bps -  will decrease lisinopril from 5 mg daily to lisinopril 2.5 mg daily  2. Chronic heart failure with preserved ejection fraction (HFpEF) (HCC) -Stable Continue-   Lasix 40 mg daily and Lasix 40 mg daily as needed for weight gain of 3 pounds overnight or 5 pounds in 1 week  3. Slow transit constipation -Continue senna 8.6 mg 1 tab daily  -   Continue MiraLAX 17 g every other day  4. Stage 3b chronic kidney disease (HCC) Lab Results  Component Value  Date   NA 139 05/20/2023   K 4.5 05/20/2023   CO2 29 (A) 05/20/2023   GLUCOSE 128 (H) 10/18/2020   BUN 37 (A) 05/20/2023   CREATININE 1.5 (A) 05/20/2023   CALCIUM 8.9 05/20/2023   EGFR 38 05/06/2023   GFRNONAA 51 (L) 10/18/2020    -  will monitor    Family/ staff Communication: Discussed plan of care with resident and charge nurse.  Labs/tests ordered: None    Azaryah Heathcock Medina-Vargas, DNP, MSN, FNP-BC [facility] [phone] (Monday-Friday 8:00 a.m. - 5:00 p.m.) [phone] (after hours)

## 2023-12-16 ENCOUNTER — Encounter: Payer: Self-pay | Admitting: Nurse Practitioner

## 2023-12-16 ENCOUNTER — Non-Acute Institutional Stay (SKILLED_NURSING_FACILITY): Payer: Self-pay | Admitting: Nurse Practitioner

## 2023-12-16 DIAGNOSIS — K5901 Slow transit constipation: Secondary | ICD-10-CM

## 2023-12-16 DIAGNOSIS — I350 Nonrheumatic aortic (valve) stenosis: Secondary | ICD-10-CM

## 2023-12-16 DIAGNOSIS — I10 Essential (primary) hypertension: Secondary | ICD-10-CM

## 2023-12-16 DIAGNOSIS — F0150 Vascular dementia without behavioral disturbance: Secondary | ICD-10-CM

## 2023-12-16 DIAGNOSIS — I5032 Chronic diastolic (congestive) heart failure: Secondary | ICD-10-CM

## 2023-12-16 DIAGNOSIS — N1832 Chronic kidney disease, stage 3b: Secondary | ICD-10-CM

## 2023-12-16 NOTE — Assessment & Plan Note (Signed)
 Blood pressure well controlled  On lisinopril 2.5 mg daily  Continue current medications and dietary modifications follow metabolic panel

## 2023-12-16 NOTE — Assessment & Plan Note (Signed)
 Ongoing and stable, without significant changes.  Continues support with staff.

## 2023-12-16 NOTE — Assessment & Plan Note (Signed)
 Stable, continues on lasix daily without worsening shortness of breath

## 2023-12-16 NOTE — Progress Notes (Signed)
 Location:  Other [facility] Room Number: [place] [id number] Place of Service:  SNF [phone]) Harlene An, NP  PCP: Laurence Locus, DO  Patient Care Team: Laurence Locus, DO as PCP - General (Internal Medicine) An Harlene POUR, NP as Nurse Practitioner (Geriatric Medicine)  Extended Emergency Contact Information Primary Emergency Contact: Gwyneth Dragon Address: [place] [address] [place] Home Phone: [phone] Mobile Phone: [phone] Relation: Daughter Secondary Emergency Contact: eisenhauer,ruth  [place] Home Phone: [phone] Mobile Phone: [phone] Relation: Daughter  Goals of care: Advanced Directive information    12/16/2023   11:38 AM  Advanced Directives  Does Patient Have a Medical Advance Directive? Yes  Type of Advance Directive Out of facility DNR (pink MOST or yellow form)  Does patient want to make changes to medical advance directive? No - Patient declined     Chief Complaint  Patient presents with   Medical Management of Chronic Issues    Medical Management of Chronic Issues.     HPI:  Pt is a 89 y.o. female seen today for medical management of chronic disease. Pt with hx of dementia, CHF. She reports she is doing well. She has no complaints of pain.  Denies shortness of breath, cough or congestion. No wheezing Reports she eats well.  She is weighted daily due to CHF and weights have been stable.  No reports of constipation.  Nursing does not have any concerns.    Past Medical History:  Diagnosis Date   Cancer (HCC) skin   Cataract    Hearing loss    Osteoporosis    Vascular dementia [facility])    Past Surgical History:  Procedure Laterality Date   BREAST BIOPSY Left neg   BREAST CYST ASPIRATION Bilateral    EYE SURGERY Bilateral    HERNIA REPAIR     INTRAMEDULLARY (IM) NAIL INTERTROCHANTERIC Left 02/02/2015   Procedure: INTRAMEDULLARY (IM) NAIL INTERTROCHANTRIC;  Surgeon:  Livingston ONEIDA Mori, MD;  Location: [facility] ORS;  Service: Orthopedics;  Laterality: Left;    Allergies  Allergen Reactions   Simvastatin     Other reaction(s): Muscle Pain   Levofloxacin Rash   Penicillins Rash    Has patient had a PCN reaction causing immediate rash, facial/tongue/throat swelling, SOB or lightheadedness with hypotension: Yes Has patient had a PCN reaction causing severe rash involving mucus membranes or skin necrosis: No Has patient had a PCN reaction that required hospitalization Yes Has patient had a PCN reaction occurring within the last 10 years: No If all of the above answers are NO, then may proceed with Cephalosporin use.    Outpatient Encounter Medications as of 12/16/2023  Medication Sig   acetaminophen  (TYLENOL ) 650 MG CR tablet Take 650 mg by mouth in the morning and at bedtime.   cyanocobalamin (VITAMIN B12) 500 MCG tablet Take 500 mcg by mouth daily.   furosemide (LASIX) 40 MG tablet Take 40 mg by mouth daily. As needed for weight gain 3lbs overnight or 5lbs in one week.   ipratropium-albuterol (DUONEB) 0.5-2.5 (3) MG/3ML SOLN Take 3 mLs by nebulization every 6 (six) hours as needed.   lisinopril (ZESTRIL) 2.5 MG tablet Take 2.5 mg by mouth daily.   melatonin 1 MG TABS tablet Take 1 mg by mouth at bedtime.   polyethylene glycol (MIRALAX / GLYCOLAX) 17 g packet Take 17 g by mouth every other day.   senna (SENOKOT) 8.6 MG tablet Take 1 tablet  by mouth daily.   OXYGEN Inhale into the lungs. 2lpm (Patient not taking: Reported on 12/16/2023)   No facility-administered encounter medications on file as of 12/16/2023.    Review of Systems  Constitutional:  Negative for activity change, appetite change, fatigue and unexpected weight change.  HENT:  Negative for congestion and hearing loss.   Eyes: Negative.   Respiratory:  Negative for cough and shortness of breath.   Cardiovascular:  Negative for chest pain, palpitations and leg swelling.  Gastrointestinal:   Negative for abdominal pain, constipation and diarrhea.  Genitourinary:  Negative for difficulty urinating and dysuria.  Musculoskeletal:  Negative for arthralgias and myalgias.  Skin:  Negative for color change and wound.  Neurological:  Negative for dizziness and weakness.  Psychiatric/Behavioral:  Negative for agitation, behavioral problems and confusion.      Immunization History  Administered Date(s) Administered   Influenza-Unspecified 11/21/2019, 11/19/2020, 11/19/2021, 11/20/2022, 11/16/2023   Moderna Covid-19 Fall Seasonal Vaccine 23yrs & older 05/12/2022, 05/14/2023   Moderna Sars-Covid-2 Vaccination 02/16/2019, 03/16/2019, 12/15/2019, 12/12/2021, 05/12/2022   Pneumococcal-Unspecified 09/04/1991   Tdap 08/14/2023   Unspecified SARS-COV-2 Vaccination 12/15/2019, 06/21/2020, 10/25/2020, 07/01/2021, 12/17/2022, 11/26/2023   Zoster Recombinant(Shingrix) 07/15/2023   Pertinent  Health Maintenance Due  Topic Date Due   Influenza Vaccine  Completed   Mammogram  Discontinued   DEXA SCAN  Discontinued      08/07/2020    3:35 PM 10/18/2020    3:41 PM 10/19/2020   12:49 PM 06/14/2023    8:46 PM 07/13/2023    1:13 PM  Fall Risk  Falls in the past year?    0 0  Was there an injury with Fall?    0   Fall Risk Category Calculator    0   (RETIRED) Patient Fall Risk Level High fall risk  Moderate fall risk  Moderate fall risk        Data saved with a previous flowsheet row definition   Functional Status Survey:    Vitals:   12/16/23 1130  BP: 100/60  Pulse: 74  Resp: 20  Temp: 98.3 F (36.8 C)  SpO2: 95%  Weight: 146 lb (66.2 kg)  Height: 5' 3 (1.6 m)   Body mass index is 25.86 kg/m. Physical Exam Constitutional:      General: She is not in acute distress.    Appearance: She is well-developed. She is not diaphoretic.  HENT:     Head: Normocephalic and atraumatic.     Mouth/Throat:     Pharynx: No oropharyngeal exudate.  Eyes:     Conjunctiva/sclera: Conjunctivae  normal.     Pupils: Pupils are equal, round, and reactive to light.  Cardiovascular:     Rate and Rhythm: Normal rate and regular rhythm.     Heart sounds: Normal heart sounds.  Pulmonary:     Effort: Pulmonary effort is normal.     Breath sounds: Normal breath sounds.  Abdominal:     General: Bowel sounds are normal.     Palpations: Abdomen is soft.  Musculoskeletal:     Cervical back: Normal range of motion and neck supple.     Right lower leg: No edema.     Left lower leg: No edema.  Skin:    General: Skin is warm and dry.  Neurological:     Mental Status: She is alert. Mental status is at baseline.     Motor: No weakness.     Gait: Gait abnormal (uses walker).  Psychiatric:  Mood and Affect: Mood normal.     Labs reviewed: Recent Labs    04/08/23 0000 05/06/23 0000 05/20/23 0000  NA 139 139 139  K 4.5 5.0 4.5  CL 103 106 102  CO2 27* 23* 29*  BUN 29* 25* 37*  CREATININE 1.3* 1.3* 1.5*  CALCIUM 9.3 9.2 8.9   No results for input(s): AST, ALT, ALKPHOS, BILITOT, PROT, ALBUMIN in the last 8760 hours. Recent Labs    03/08/23 0000 04/08/23 0000 05/06/23 0000  WBC 5.3 8.5 6.0  NEUTROABS 3,694.00  --   --   HGB 14.5 13.3 12.8  HCT 45 40 40  PLT 215 210 198   Lab Results  Component Value Date   TSH 2.07 03/08/2023   Lab Results  Component Value Date   HGBA1C 5.5 02/03/2015   No results found for: CHOL, HDL, LDLCALC, LDLDIRECT, TRIG, CHOLHDL  Significant Diagnostic Results in last 30 days:  No results found.  Assessment/Plan Vascular dementia (HCC) Ongoing and stable, without significant changes.  Continues support with staff.   Severe aortic stenosis Stable, continues on lasix daily without worsening shortness of breath  HTN (hypertension) Blood pressure well controlled  On lisinopril 2.5 mg daily  Continue current medications and dietary modifications follow metabolic panel  Constipation Controlled on miralax  and senna  (HFpEF) heart failure with preserved ejection fraction (HCC) Euvolemic at this time, continues on lasix 40 mg daily with additional dose for weight gain  Stage 3b chronic kidney disease (HCC) Chronic and stable Encourage proper hydration Follow metabolic panel Avoid nephrotoxic meds (NSAIDS)     Dashauna Heymann K. Caro BODILY [facility] [phone]

## 2023-12-16 NOTE — Assessment & Plan Note (Signed)
 Euvolemic at this time, continues on lasix 40 mg daily with additional dose for weight gain

## 2023-12-16 NOTE — Assessment & Plan Note (Signed)
 Controlled on miralax and senna

## 2023-12-16 NOTE — Assessment & Plan Note (Signed)
 Chronic and stable Encourage proper hydration Follow metabolic panel Avoid nephrotoxic meds (NSAIDS)

## 2023-12-20 LAB — CBC AND DIFFERENTIAL
HCT: 39 (ref 36–46)
Hemoglobin: 12.9 (ref 12.0–16.0)
Platelets: 181 K/uL (ref 150–400)
WBC: 4.5

## 2023-12-20 LAB — CBC: RBC: 3.78 — AB (ref 3.87–5.11)

## 2023-12-20 LAB — BASIC METABOLIC PANEL WITH GFR
BUN: 38 — AB (ref 4–21)
CO2: 26 — AB (ref 13–22)
Chloride: 107 (ref 99–108)
Creatinine: 1.5 — AB (ref 0.5–1.1)
Glucose: 77
Potassium: 5 meq/L (ref 3.5–5.1)
Sodium: 140 (ref 137–147)

## 2023-12-20 LAB — HEPATIC FUNCTION PANEL
ALT: 8 U/L (ref 7–35)
AST: 13 (ref 13–35)
Alkaline Phosphatase: 95 (ref 25–125)
Bilirubin, Total: 0.4

## 2023-12-20 LAB — COMPREHENSIVE METABOLIC PANEL WITH GFR
Albumin: 3.3 — AB (ref 3.5–5.0)
Calcium: 8.9 (ref 8.7–10.7)
Globulin: 2.1
eGFR: 30

## 2024-01-25 ENCOUNTER — Non-Acute Institutional Stay (SKILLED_NURSING_FACILITY): Payer: Self-pay | Admitting: Internal Medicine

## 2024-01-25 ENCOUNTER — Encounter: Payer: Self-pay | Admitting: Internal Medicine

## 2024-01-25 DIAGNOSIS — H903 Sensorineural hearing loss, bilateral: Secondary | ICD-10-CM | POA: Diagnosis not present

## 2024-01-25 DIAGNOSIS — E782 Mixed hyperlipidemia: Secondary | ICD-10-CM | POA: Diagnosis not present

## 2024-01-25 DIAGNOSIS — N1832 Chronic kidney disease, stage 3b: Secondary | ICD-10-CM

## 2024-01-25 DIAGNOSIS — F0150 Vascular dementia without behavioral disturbance: Secondary | ICD-10-CM

## 2024-01-25 DIAGNOSIS — I5032 Chronic diastolic (congestive) heart failure: Secondary | ICD-10-CM | POA: Diagnosis not present

## 2024-01-25 DIAGNOSIS — I350 Nonrheumatic aortic (valve) stenosis: Secondary | ICD-10-CM

## 2024-01-25 DIAGNOSIS — I10 Essential (primary) hypertension: Secondary | ICD-10-CM

## 2024-01-25 NOTE — Assessment & Plan Note (Addendum)
 BP stable.  Continue lisinopril 2.5 mg daily and Lasix 40 mg daily

## 2024-01-25 NOTE — Assessment & Plan Note (Signed)
 Stable.  Last BMP December 20, 2023 showed a BUN of 38 and a creatinine of 1.5.  This is stable.

## 2024-01-25 NOTE — Assessment & Plan Note (Signed)
 Continue with Lasix 40 mg a day and lisinopril 2.5 mg daily.

## 2024-01-25 NOTE — Progress Notes (Signed)
 [facility] Routine Visit Progress Note    Location:  Other [place].  Nursing Home Room Number: [facility] [id number] Place of Service:  SNF (31)   PCP: Laurence Locus, DO   Patient Care Team: Laurence Locus, DO as PCP - General (Internal Medicine) Caro Harlene POUR, NP as Nurse Practitioner (Geriatric Medicine)   Extended Emergency Contact Information Primary Emergency Contact: Baldwin,Christian Address: [address], [place] [address] [place] Home Phone: [phone] Mobile Phone: [phone] Relation: Daughter Secondary Emergency Contact: eisenhauer,ruth  [place] Home Phone: [phone] Mobile Phone: [phone] Relation: Daughter   Goals of care: Advanced Directive information    12/16/2023   11:38 AM  Advanced Directives  Does Patient Have a Medical Advance Directive? Yes  Type of Advance Directive Out of facility DNR (pink MOST or yellow form)  Does patient want to make changes to medical advance directive? No - Patient declined    CODE STATUS: Do Not Resuscitate (DNR)   Chief Complaint  Patient presents with   Medical Management of Chronic Issues    Medical Management of Chronic Issues.      HPI: Pt is a 89 y.o. female seen today for medical management of chronic disease.   Ms.Caterine is a 89 year old female who turned 89 years old on August 2.  She has a history of mild vascular dementia, stage III B chronic kidney disease, essential hypertension, severe aortic stenosis, chronic diastolic heart failure.  She states that she is feeling well.  She remembers growing up during World War II.  She remembers living in [place] .  She states that she has gotten to 89 because she had to learn how to take care of herself when she was young because her mother contracted malaria while they were living in [place] .   Past Medical History:  Diagnosis Date   Cancer (HCC) skin   Cataract    Hearing loss    Osteoporosis    Vascular dementia  [facility])    Past Surgical History:  Procedure Laterality Date   BREAST BIOPSY Left neg   BREAST CYST ASPIRATION Bilateral    EYE SURGERY Bilateral    HERNIA REPAIR     INTRAMEDULLARY (IM) NAIL INTERTROCHANTERIC Left 02/02/2015   Procedure: INTRAMEDULLARY (IM) NAIL INTERTROCHANTRIC;  Surgeon: Livingston ONEIDA Mori, MD;  Location: [facility] ORS;  Service: Orthopedics;  Laterality: Left;     Allergies[1]   Outpatient Encounter Medications as of 01/25/2024  Medication Sig   acetaminophen  (TYLENOL ) 650 MG CR tablet Take 650 mg by mouth in the morning and at bedtime.   cyanocobalamin (VITAMIN B12) 500 MCG tablet Take 500 mcg by mouth daily.   furosemide (LASIX) 40 MG tablet Take 40 mg by mouth daily. As needed for weight gain 3lbs overnight or 5lbs in one week.   ipratropium-albuterol (DUONEB) 0.5-2.5 (3) MG/3ML SOLN Take 3 mLs by nebulization every 6 (six) hours as needed.   lisinopril (ZESTRIL) 2.5 MG tablet Take 2.5 mg by mouth daily.   melatonin 1 MG TABS tablet Take 1 mg by mouth at bedtime.   polyethylene glycol (MIRALAX / GLYCOLAX) 17 g packet Take 17 g by mouth every other day.   senna (SENOKOT) 8.6 MG tablet Take 1 tablet by mouth daily.   No facility-administered encounter medications on file as of 01/25/2024.     Review of Systems  Constitutional: Negative.   HENT: Negative.    Eyes: Negative.   Respiratory: Negative.  Cardiovascular: Negative.   Gastrointestinal: Negative.   Endocrine: Negative.   Genitourinary: Negative.   Musculoskeletal: Negative.   Allergic/Immunologic: Negative.   Neurological: Negative.   Hematological: Negative.   Psychiatric/Behavioral: Negative.    All other systems reviewed and are negative.     Immunization History  Administered Date(s) Administered   Influenza-Unspecified 11/21/2019, 11/19/2020, 11/19/2021, 11/20/2022, 11/16/2023   Moderna Covid-19 Fall Seasonal Vaccine 77yrs & older 05/12/2022, 05/14/2023   Moderna Sars-Covid-2 Vaccination  02/16/2019, 03/16/2019, 12/15/2019, 12/12/2021, 05/12/2022   PNEUMOCOCCAL CONJUGATE-20 09/14/2023   Pneumococcal-Unspecified 09/04/1991   Tdap 08/14/2023   Unspecified SARS-COV-2 Vaccination 12/15/2019, 06/21/2020, 10/25/2020, 07/01/2021, 12/17/2022, 12/17/2022, 11/26/2023   Zoster Recombinant(Shingrix) 07/15/2023   Pertinent  Health Maintenance Due  Topic Date Due   Influenza Vaccine  Completed   Mammogram  Discontinued   Bone Density Scan  Discontinued      08/07/2020    3:35 PM 10/18/2020    3:41 PM 10/19/2020   12:49 PM 06/14/2023    8:46 PM 07/13/2023    1:13 PM  Fall Risk  Falls in the past year?    0 0  Was there an injury with Fall?    0    Fall Risk Category Calculator    0   (RETIRED) Patient Fall Risk Level High fall risk  Moderate fall risk  Moderate fall risk        Data saved with a previous flowsheet row definition   Functional Status Survey:     Vitals:   01/25/24 0853  BP: 116/68  Pulse: 91  Resp: 18  Temp: 97.7 F (36.5 C)  SpO2: 94%  Weight: 145 lb 12.8 oz (66.1 kg)  Height: 5' 3 (1.6 m)   Body mass index is 25.83 kg/m. Physical Exam Vitals and nursing note reviewed.  Constitutional:      General: She is not in acute distress.    Appearance: She is not toxic-appearing or diaphoretic.  HENT:     Head: Normocephalic and atraumatic.     Nose: Nose normal.  Eyes:     General: No scleral icterus. Cardiovascular:     Rate and Rhythm: Normal rate and regular rhythm.     Heart sounds: Murmur heard.     Comments: Harsh 3 out of 6 systolic ejection murmur left upper sternal border.  Consistent with aortic stenosis murmur. Abdominal:     General: Bowel sounds are normal. There is no distension.     Palpations: Abdomen is soft.  Musculoskeletal:     Right lower leg: No edema.     Left lower leg: No edema.  Skin:    General: Skin is warm and dry.     Capillary Refill: Capillary refill takes less than 2 seconds.  Neurological:     Mental Status: She  is alert.     Comments: Very hard of hearing.  She notes that she turned 89 in August.      Labs reviewed: Recent Labs    05/06/23 0000 05/20/23 0000 12/20/23 0000  NA 139 139 140  K 5.0 4.5 5.0  CL 106 102 107  CO2 23* 29* 26*  BUN 25* 37* 38*  CREATININE 1.3* 1.5* 1.5*  CALCIUM 9.2 8.9 8.9   Recent Labs    12/20/23 0000  AST 13  ALT 8  ALKPHOS 95  ALBUMIN 3.3*   Recent Labs    03/08/23 0000 04/08/23 0000 05/06/23 0000 12/20/23 0000  WBC 5.3 8.5 6.0 4.5  NEUTROABS 3,694.00  --   --   --  HGB 14.5 13.3 12.8 12.9  HCT 45 40 40 39  PLT 215 210 198 181   Lab Results  Component Value Date   TSH 2.07 03/08/2023   Lab Results  Component Value Date   HGBA1C 5.5 02/03/2015   Assessment/Plan Vascular dementia without behavioral disturbance (HCC) Stable.  She is not on any antipsychotics or dementia medications.  Stage 3b chronic kidney disease (HCC) - baseline scr 1.5 Stable.  Last BMP December 20, 2023 showed a BUN of 38 and a creatinine of 1.5.  This is stable.  Essential hypertension BP stable.  Continue lisinopril 2.5 mg daily and Lasix 40 mg daily  Mixed hyperlipidemia Stable.  At her age, not checking lipid panels or treating her with statins.  Severe aortic stenosis Patient with a harsh systolic ejection murmur consistent with aortic stenosis.  At her age of 57, she is not a TAVR or surgical AVR candidate.  Chronic heart failure with preserved ejection fraction (HFpEF) (HCC) Continue with Lasix 40 mg a day and lisinopril 2.5 mg daily.  Bilateral hearing loss Chronic.   Camellia Door, DO  [facility] [phone]      [1]  Allergies Allergen Reactions   Simvastatin     Other reaction(s): Muscle Pain   Levofloxacin Rash   Penicillins Rash    Has patient had a PCN reaction causing immediate rash, facial/tongue/throat swelling, SOB or lightheadedness with hypotension: Yes Has patient had a PCN reaction causing  severe rash involving mucus membranes or skin necrosis: No Has patient had a PCN reaction that required hospitalization Yes Has patient had a PCN reaction occurring within the last 10 years: No If all of the above answers are NO, then may proceed with Cephalosporin use.

## 2024-01-25 NOTE — Assessment & Plan Note (Signed)
 Chronic

## 2024-01-25 NOTE — Assessment & Plan Note (Signed)
 Stable.  She is not on any antipsychotics or dementia medications.

## 2024-01-25 NOTE — Assessment & Plan Note (Signed)
 Patient with a harsh systolic ejection murmur consistent with aortic stenosis.  At her age of 83, she is not a TAVR or surgical AVR candidate.

## 2024-01-25 NOTE — Assessment & Plan Note (Signed)
 Stable.  At her age, not checking lipid panels or treating her with statins.

## 2024-02-22 ENCOUNTER — Non-Acute Institutional Stay (SKILLED_NURSING_FACILITY): Payer: Self-pay | Admitting: Nurse Practitioner

## 2024-02-22 ENCOUNTER — Encounter: Payer: Self-pay | Admitting: Nurse Practitioner

## 2024-02-22 DIAGNOSIS — I10 Essential (primary) hypertension: Secondary | ICD-10-CM | POA: Diagnosis not present

## 2024-02-22 DIAGNOSIS — N1832 Chronic kidney disease, stage 3b: Secondary | ICD-10-CM

## 2024-02-22 DIAGNOSIS — I5032 Chronic diastolic (congestive) heart failure: Secondary | ICD-10-CM | POA: Diagnosis not present

## 2024-02-22 DIAGNOSIS — K5901 Slow transit constipation: Secondary | ICD-10-CM

## 2024-02-22 DIAGNOSIS — I350 Nonrheumatic aortic (valve) stenosis: Secondary | ICD-10-CM | POA: Diagnosis not present

## 2024-02-22 DIAGNOSIS — F0150 Vascular dementia without behavioral disturbance: Secondary | ICD-10-CM

## 2024-02-22 NOTE — Assessment & Plan Note (Signed)
 Stable at this time, no signs of fluid overload.

## 2024-02-22 NOTE — Assessment & Plan Note (Signed)
 Controlled on miralax and senna

## 2024-02-22 NOTE — Assessment & Plan Note (Signed)
 BP stable.  Continue lisinopril 2.5 mg daily and Lasix 40 mg daily

## 2024-02-22 NOTE — Progress Notes (Signed)
 " Location:  Other [place].  Nursing Home Room Number: [facility] [id number] Place of Service:  SNF [phone]) Harlene An, NP  PCP: Laurence Locus, DO  Patient Care Team: Laurence Locus, DO as PCP - General (Internal Medicine) An Harlene POUR, NP as Nurse Practitioner (Geriatric Medicine)  Extended Emergency Contact Information Primary Emergency Contact: Baldwin,Kimberl Address: [address], [place] [address] [place] Home Phone: [phone] Mobile Phone: [phone] Relation: Daughter Secondary Emergency Contact: eisenhauer,ruth  [place] Home Phone: [phone] Mobile Phone: [phone] Relation: Daughter  Goals of care: Advanced Directive information    02/22/2024   12:02 PM  Advanced Directives  Does Patient Have a Medical Advance Directive? Yes  Type of Advance Directive Out of facility DNR (pink MOST or yellow form)  Does patient want to make changes to medical advance directive? No - Patient declined     Chief Complaint  Patient presents with   Medical Management of Chronic Issues    Medical Management of Chronic Issues.     HPI:  Pt is a 89 y.o. female seen today for medical management of chronic disease.  Pt with hx of dementia, aortic stenosis, CKD.  She has been doing well. Slightly LE edema- compression hoses are off because she has a skin tear to left lower leg that staff is treating. No signs of infection.  She has no shortness of breath, chest pains, or palpitations. Reports she sleeps well at night.  No anxiety or depression.  Staff has no concerns.    Past Medical History:  Diagnosis Date   Cancer (HCC) skin   Cataract    Hearing loss    Osteoporosis    Vascular dementia [facility])    Past Surgical History:  Procedure Laterality Date   BREAST BIOPSY Left neg   BREAST CYST ASPIRATION Bilateral    EYE SURGERY Bilateral    HERNIA REPAIR     INTRAMEDULLARY (IM) NAIL INTERTROCHANTERIC Left 02/02/2015   Procedure:  INTRAMEDULLARY (IM) NAIL INTERTROCHANTRIC;  Surgeon: Livingston ONEIDA Mori, MD;  Location: [facility] ORS;  Service: Orthopedics;  Laterality: Left;    Allergies[1]  Outpatient Encounter Medications as of 02/22/2024  Medication Sig   acetaminophen  (TYLENOL ) 650 MG CR tablet Take 650 mg by mouth in the morning and at bedtime.   cyanocobalamin (VITAMIN B12) 500 MCG tablet Take 500 mcg by mouth daily.   furosemide (LASIX) 40 MG tablet Take 40 mg by mouth daily. As needed for weight gain 3lbs overnight or 5lbs in one week.   lisinopril (ZESTRIL) 2.5 MG tablet Take 2.5 mg by mouth daily.   melatonin 1 MG TABS tablet Take 1 mg by mouth at bedtime.   polyethylene glycol (MIRALAX / GLYCOLAX) 17 g packet Take 17 g by mouth every other day.   senna (SENOKOT) 8.6 MG tablet Take 1 tablet by mouth daily.   ipratropium-albuterol (DUONEB) 0.5-2.5 (3) MG/3ML SOLN Take 3 mLs by nebulization every 6 (six) hours as needed. (Patient not taking: Reported on 02/22/2024)   No facility-administered encounter medications on file as of 02/22/2024.    Review of Systems  Constitutional:  Negative for activity change, appetite change, fatigue and unexpected weight change.  HENT:  Negative for congestion and hearing loss.   Eyes: Negative.   Respiratory:  Negative for cough and shortness of breath.   Cardiovascular:  Positive for leg swelling. Negative for chest pain and palpitations.  Gastrointestinal:  Negative for abdominal pain,  constipation and diarrhea.  Genitourinary:  Negative for difficulty urinating and dysuria.  Musculoskeletal:  Negative for arthralgias and myalgias.  Skin:  Negative for color change and wound.  Neurological:  Negative for dizziness and weakness.  Psychiatric/Behavioral:  Positive for confusion. Negative for agitation and behavioral problems.      Immunization History  Administered Date(s) Administered   Influenza-Unspecified 11/21/2019, 11/19/2020, 11/19/2021, 11/20/2022, 11/16/2023   Moderna  Covid-19 Fall Seasonal Vaccine 73yrs & older 05/12/2022, 05/14/2023   Moderna Sars-Covid-2 Vaccination 02/16/2019, 03/16/2019, 12/15/2019, 12/12/2021, 05/12/2022   PNEUMOCOCCAL CONJUGATE-20 09/14/2023   Pneumococcal-Unspecified 09/04/1991   Tdap 08/14/2023   Unspecified SARS-COV-2 Vaccination 12/15/2019, 06/21/2020, 10/25/2020, 07/01/2021, 12/17/2022, 12/17/2022, 11/26/2023   Zoster Recombinant(Shingrix) 07/15/2023, 10/19/2023   Pertinent  Health Maintenance Due  Topic Date Due   Influenza Vaccine  Completed   Mammogram  Discontinued   Bone Density Scan  Discontinued      08/07/2020    3:35 PM 10/18/2020    3:41 PM 10/19/2020   12:49 PM 06/14/2023    8:46 PM 07/13/2023    1:13 PM  Fall Risk  Falls in the past year?    0 0  Was there an injury with Fall?    0    Fall Risk Category Calculator    0   (RETIRED) Patient Fall Risk Level High fall risk  Moderate fall risk  Moderate fall risk        Data saved with a previous flowsheet row definition   Functional Status Survey:    Vitals:   02/22/24 1158  BP: 115/60  Pulse: 95  Resp: 16  Temp: 98 F (36.7 C)  SpO2: 94%  Weight: 147 lb 12.8 oz (67 kg)  Height: 5' 3 (1.6 m)   Body mass index is 26.18 kg/m. Wt Readings from Last 3 Encounters:  02/22/24 147 lb 12.8 oz (67 kg)  01/25/24 145 lb 12.8 oz (66.1 kg)  12/16/23 146 lb (66.2 kg)    Physical Exam Constitutional:      General: She is not in acute distress.    Appearance: She is well-developed. She is not diaphoretic.  HENT:     Head: Normocephalic and atraumatic.     Mouth/Throat:     Pharynx: No oropharyngeal exudate.  Eyes:     Conjunctiva/sclera: Conjunctivae normal.     Pupils: Pupils are equal, round, and reactive to light.  Cardiovascular:     Rate and Rhythm: Normal rate and regular rhythm.     Heart sounds: Normal heart sounds.  Pulmonary:     Effort: Pulmonary effort is normal.     Breath sounds: Normal breath sounds.  Abdominal:     General: Bowel  sounds are normal.     Palpations: Abdomen is soft.  Musculoskeletal:     Cervical back: Normal range of motion and neck supple.     Right lower leg: Edema (1+) present.     Left lower leg: Edema (1+) present.  Skin:    General: Skin is warm and dry.  Neurological:     Mental Status: She is alert. Mental status is at baseline.  Psychiatric:        Mood and Affect: Mood normal.     Labs reviewed: Recent Labs    05/06/23 0000 05/20/23 0000 12/20/23 0000  NA 139 139 140  K 5.0 4.5 5.0  CL 106 102 107  CO2 23* 29* 26*  BUN 25* 37* 38*  CREATININE 1.3* 1.5* 1.5*  CALCIUM 9.2 8.9 8.9  Recent Labs    12/20/23 0000  AST 13  ALT 8  ALKPHOS 95  ALBUMIN 3.3*   Recent Labs    03/08/23 0000 04/08/23 0000 05/06/23 0000 12/20/23 0000  WBC 5.3 8.5 6.0 4.5  NEUTROABS 3,694.00  --   --   --   HGB 14.5 13.3 12.8 12.9  HCT 45 40 40 39  PLT 215 210 198 181   Lab Results  Component Value Date   TSH 2.07 03/08/2023   Lab Results  Component Value Date   HGBA1C 5.5 02/03/2015   No results found for: CHOL, HDL, LDLCALC, LDLDIRECT, TRIG, CHOLHDL  Significant Diagnostic Results in last 30 days:  No results found.  Assessment/Plan Vascular dementia without behavioral disturbance (HCC) -Stable, no acute changes in cognitive or functional status, continue supportive care.  Doing well at facility.   Stage 3b chronic kidney disease (HCC) - baseline scr 1.5 Chronic and stable Encourage proper hydration Follow metabolic panel Avoid nephrotoxic meds (NSAIDS)   Severe aortic stenosis Stable at this time, no signs of fluid overload.   Essential hypertension BP stable.  Continue lisinopril 2.5 mg daily and Lasix 40 mg daily  Constipation Controlled on miralax and senna  Chronic heart failure with preserved ejection fraction (HFpEF) (HCC) stable, Continue with Lasix 40 mg a day and lisinopril 2.5 mg daily. Some worsening LE edema Weights stable- unable to  wear compression hose due to skin tear noted to left lower leg.      Mialee Weyman K. Caro BODILY [facility] [phone]       [1]  Allergies Allergen Reactions   Simvastatin     Other reaction(s): Muscle Pain   Levofloxacin Rash   Penicillins Rash    Has patient had a PCN reaction causing immediate rash, facial/tongue/throat swelling, SOB or lightheadedness with hypotension: Yes Has patient had a PCN reaction causing severe rash involving mucus membranes or skin necrosis: No Has patient had a PCN reaction that required hospitalization Yes Has patient had a PCN reaction occurring within the last 10 years: No If all of the above answers are NO, then may proceed with Cephalosporin use.   "

## 2024-02-22 NOTE — Assessment & Plan Note (Addendum)
 stable, Continue with Lasix 40 mg a day and lisinopril 2.5 mg daily. Some worsening LE edema Weights stable- unable to wear compression hose due to skin tear noted to left lower leg.

## 2024-02-22 NOTE — Assessment & Plan Note (Signed)
 Chronic and stable Encourage proper hydration Follow metabolic panel Avoid nephrotoxic meds (NSAIDS)

## 2024-02-22 NOTE — Assessment & Plan Note (Signed)
-  Stable, no acute changes in cognitive or functional status, continue supportive care.  Doing well at facility.
